# Patient Record
Sex: Male | Born: 1935 | Race: Black or African American | Hispanic: No | Smoking: Never smoker
Health system: Southern US, Community
[De-identification: ages and names within clinical notes are randomized; demographics above are authoritative.]

## PROBLEM LIST (undated history)

## (undated) DIAGNOSIS — G9341 Metabolic encephalopathy: Secondary | ICD-10-CM

## (undated) DIAGNOSIS — E785 Hyperlipidemia, unspecified: Secondary | ICD-10-CM

## (undated) DIAGNOSIS — R55 Syncope and collapse: Secondary | ICD-10-CM

## (undated) DIAGNOSIS — E119 Type 2 diabetes mellitus without complications: Secondary | ICD-10-CM

## (undated) DIAGNOSIS — R413 Other amnesia: Secondary | ICD-10-CM

## (undated) HISTORY — PX: CYST REMOVAL TRUNK: SHX6283

---

## 2020-08-13 ENCOUNTER — Ambulatory Visit (HOSPITAL_COMMUNITY)
Admission: EM | Admit: 2020-08-13 | Discharge: 2020-08-13 | Disposition: A | Discharge status: Home / Self Care | Payer: Medicare Other | Attending: Family Medicine | Admitting: Family Medicine

## 2020-08-13 ENCOUNTER — Encounter (HOSPITAL_COMMUNITY): Payer: Self-pay

## 2020-08-13 ENCOUNTER — Other Ambulatory Visit: Payer: Self-pay

## 2020-08-13 DIAGNOSIS — R4182 Altered mental status, unspecified: Secondary | ICD-10-CM | POA: Insufficient documentation

## 2020-08-13 DIAGNOSIS — Z5189 Encounter for other specified aftercare: Secondary | ICD-10-CM | POA: Insufficient documentation

## 2020-08-13 LAB — CBC WITH DIFFERENTIAL/PLATELET
Abs Immature Granulocytes: 0 10*3/uL (ref 0.00–0.07)
Basophils Absolute: 0 10*3/uL (ref 0.0–0.1)
Basophils Relative: 0 %
Eosinophils Absolute: 0.1 10*3/uL (ref 0.0–0.5)
Eosinophils Relative: 3 %
HCT: 30.6 % — ABNORMAL LOW (ref 39.0–52.0)
Hemoglobin: 10.8 g/dL — ABNORMAL LOW (ref 13.0–17.0)
Lymphocytes Relative: 26 %
Lymphs Abs: 1.2 10*3/uL (ref 0.7–4.0)
MCH: 35.1 pg — ABNORMAL HIGH (ref 26.0–34.0)
MCHC: 35.3 g/dL (ref 30.0–36.0)
MCV: 99.4 fL (ref 80.0–100.0)
Monocytes Absolute: 0.3 10*3/uL (ref 0.1–1.0)
Monocytes Relative: 7 %
Neutro Abs: 2.9 10*3/uL (ref 1.7–7.7)
Neutrophils Relative %: 64 %
Platelets: 305 10*3/uL (ref 150–400)
RBC: 3.08 MIL/uL — ABNORMAL LOW (ref 4.22–5.81)
RDW: 15.1 % (ref 11.5–15.5)
WBC: 4.6 10*3/uL (ref 4.0–10.5)
nRBC: 0 % (ref 0.0–0.2)

## 2020-08-13 LAB — POCT URINALYSIS DIPSTICK, ED / UC
Glucose, UA: NEGATIVE mg/dL
Ketones, ur: NEGATIVE mg/dL
Leukocytes,Ua: NEGATIVE
Nitrite: NEGATIVE
Protein, ur: 100 mg/dL — AB
Specific Gravity, Urine: >=1.03 (ref 1.005–1.030)
Urobilinogen, UA: 1 mg/dL (ref 0.0–1.0)
pH: 5.5 (ref 5.0–8.0)

## 2020-08-13 LAB — COMPREHENSIVE METABOLIC PANEL
ALT: 37 U/L (ref 0–44)
AST: 38 U/L (ref 15–41)
Albumin: 3 g/dL — ABNORMAL LOW (ref 3.5–5.0)
Alkaline Phosphatase: 87 U/L (ref 38–126)
Anion gap: 7 (ref 5–15)
BUN: 9 mg/dL (ref 8–23)
CO2: 24 mmol/L (ref 22–32)
Calcium: 8.7 mg/dL — ABNORMAL LOW (ref 8.9–10.3)
Chloride: 108 mmol/L (ref 98–111)
Creatinine, Ser: 1.08 mg/dL (ref 0.61–1.24)
GFR, Estimated: >60 mL/min (ref >60)
Glucose, Bld: 108 mg/dL — ABNORMAL HIGH (ref 70–99)
Potassium: 3.8 mmol/L (ref 3.5–5.1)
Sodium: 139 mmol/L (ref 135–145)
Total Bilirubin: 0.6 mg/dL (ref 0.3–1.2)
Total Protein: 7.6 g/dL (ref 6.5–8.1)

## 2020-08-13 NOTE — ED Triage Notes (Signed)
Pt's. Son report pt is here for wound check. Right lateral chest area cyst was removed back in October. Son reports new cyst appeared to upper left shoulder area as well about two weeks ago. Son reports housekeeper shared pt not eating well and weakness noted. Son shares concerns with mental status changes that he noted Thursday.   At this time unable to confirm home medications.   Son reports no abdominal pain, fever, or chills.

## 2020-08-13 NOTE — Discharge Instructions (Addendum)
We are waiting on your blood tests and will notify you of any significant changes.

## 2020-08-14 LAB — URINE CULTURE: Culture: NO GROWTH

## 2020-08-15 NOTE — ED Provider Notes (Signed)
[facility]   [id number] 08/13/20 Arrival Time: 1554  ASSESSMENT & PLAN:  1. Visit for wound check   2. Altered mental status, unspecified altered mental status type     Son reports much improvement since his father has been with him. Eating normally now. Mental status at baseline. CBC, CMP pending. No signs of infection on U/A. Son is comfortable with outpatient observation.   Follow-up Information    [facility] EMERGENCY DEPARTMENT.   Specialty: Emergency Medicine Why: If symptoms worsen in any way. Contact information: [address] [id number] mc [place] [address] [phone]              Reviewed expectations re: course of current medical issues. Questions answered. Outlined signs and symptoms indicating need for more acute intervention. Understanding verbalized. After Visit Summary given.   SUBJECTIVE: History from: Patient's son. [place] 86 [place] son went to pick him up yesterday after housekeeper reported that Samuel Carr was staying in bed most of the day and apparently not eating. Since picking him up he as eaten fairly well. No significant confusion. Afebrile. Ambulatory without difficulty. No LE edema. No known recent illnesses.  Son reports recent "surgery" to R axilla. Desires wound check.  OBJECTIVE:  Vitals:   08/13/20 1643  BP: 128/86  Pulse: 88  Resp: (!) 21  Temp: 99 F (37.2 C)  TempSrc: Oral  SpO2: 99%    Recheck RR: 16 General appearance: alert; no distress Eyes: PERRLA; EOMI; conjunctiva normal HENT: [place]; AT; without nasal congestion Neck: supple  CV: regular Lungs: speaks full sentences without difficulty; unlabored; CTAB Extremities: no edema Skin: warm and dry; well-healed R axilla wound Neurologic: normal gait but slow Psychological: alert and cooperative; normal mood and affect  NKDA  History reviewed. No pertinent past  medical history. Social History   Socioeconomic History  . Marital status: Widowed    Spouse name: Not on file  . Number of children: Not on file  . Years of education: Not on file  . Highest education level: Not on file  Occupational History  . Not on file  Tobacco Use  . Smoking status: Never Smoker  . Smokeless tobacco: Never Used  Substance and Sexual Activity  . Alcohol use: Never  . Drug use: Never  . Sexual activity: Not on file  Other Topics Concern  . Not on file  Social History Narrative  . Not on file   Social Determinants of Health   Financial Resource Strain:   . Difficulty of Paying Living Expenses: Not on file  Food Insecurity:   . Worried About Programme researcher, broadcasting/film/video in the Last Year: Not on file  . Ran Out of Food in the Last Year: Not on file  Transportation Needs:   . Lack of Transportation (Medical): Not on file  . Lack of Transportation (Non-Medical): Not on file  Physical Activity:   . Days of Exercise per Week: Not on file  . Minutes of Exercise per Session: Not on file  Stress:   . Feeling of Stress : Not on file  Social Connections:   . Frequency of Communication with Friends and Family: Not on file  . Frequency of Social Gatherings with Friends and Family: Not on file  . Attends Religious Services: Not on file  . Active Member of Clubs or Organizations: Not on file  . Attends Banker Meetings: Not on file  .  Marital Status: Not on file  Intimate Partner Violence:   . Fear of Current or Ex-Partner: Not on file  . Emotionally Abused: Not on file  . Physically Abused: Not on file  . Sexually Abused: Not on file   History reviewed. No pertinent family history. Past Surgical History:  Procedure Laterality Date  . CYST REMOVAL Janalyn Rouse, MD 08/15/20 [phone]

## 2020-09-21 ENCOUNTER — Encounter: Payer: Self-pay | Admitting: Emergency Medicine

## 2020-09-21 ENCOUNTER — Encounter: Payer: Self-pay | Admitting: Neurology

## 2020-09-21 ENCOUNTER — Emergency Department: Payer: Medicare Other

## 2020-09-21 ENCOUNTER — Other Ambulatory Visit: Payer: Self-pay

## 2020-09-21 ENCOUNTER — Observation Stay
Admission: EM | Admit: 2020-09-21 | Discharge: 2020-09-23 | Disposition: A | Discharge status: Home / Self Care | Payer: Medicare Other | Attending: Internal Medicine | Admitting: Internal Medicine

## 2020-09-21 DIAGNOSIS — W19XXXA Unspecified fall, initial encounter: Secondary | ICD-10-CM | POA: Diagnosis not present

## 2020-09-21 DIAGNOSIS — E119 Type 2 diabetes mellitus without complications: Secondary | ICD-10-CM

## 2020-09-21 DIAGNOSIS — M6281 Muscle weakness (generalized): Secondary | ICD-10-CM | POA: Diagnosis not present

## 2020-09-21 DIAGNOSIS — R55 Syncope and collapse: Secondary | ICD-10-CM | POA: Diagnosis not present

## 2020-09-21 DIAGNOSIS — R404 Transient alteration of awareness: Secondary | ICD-10-CM | POA: Diagnosis not present

## 2020-09-21 DIAGNOSIS — Z20822 Contact with and (suspected) exposure to covid-19: Secondary | ICD-10-CM | POA: Insufficient documentation

## 2020-09-21 DIAGNOSIS — Z23 Encounter for immunization: Secondary | ICD-10-CM | POA: Insufficient documentation

## 2020-09-21 DIAGNOSIS — E785 Hyperlipidemia, unspecified: Secondary | ICD-10-CM | POA: Diagnosis present

## 2020-09-21 DIAGNOSIS — Z79899 Other long term (current) drug therapy: Secondary | ICD-10-CM | POA: Insufficient documentation

## 2020-09-21 DIAGNOSIS — I10 Essential (primary) hypertension: Secondary | ICD-10-CM | POA: Insufficient documentation

## 2020-09-21 DIAGNOSIS — R413 Other amnesia: Secondary | ICD-10-CM | POA: Diagnosis present

## 2020-09-21 DIAGNOSIS — R112 Nausea with vomiting, unspecified: Secondary | ICD-10-CM | POA: Diagnosis not present

## 2020-09-21 DIAGNOSIS — N179 Acute kidney failure, unspecified: Secondary | ICD-10-CM | POA: Diagnosis present

## 2020-09-21 DIAGNOSIS — Y92009 Unspecified place in unspecified non-institutional (private) residence as the place of occurrence of the external cause: Secondary | ICD-10-CM | POA: Insufficient documentation

## 2020-09-21 DIAGNOSIS — Z7984 Long term (current) use of oral hypoglycemic drugs: Secondary | ICD-10-CM | POA: Insufficient documentation

## 2020-09-21 DIAGNOSIS — R1110 Vomiting, unspecified: Secondary | ICD-10-CM | POA: Diagnosis present

## 2020-09-21 DIAGNOSIS — G9341 Metabolic encephalopathy: Secondary | ICD-10-CM | POA: Diagnosis present

## 2020-09-21 HISTORY — DX: Type 2 diabetes mellitus without complications: E11.9

## 2020-09-21 HISTORY — DX: Other amnesia: R41.3

## 2020-09-21 HISTORY — DX: Hyperlipidemia, unspecified: E78.5

## 2020-09-21 LAB — CBC
HCT: 35.9 % — ABNORMAL LOW (ref 39.0–52.0)
Hemoglobin: 12.1 g/dL — ABNORMAL LOW (ref 13.0–17.0)
MCH: 33.2 pg (ref 26.0–34.0)
MCHC: 33.7 g/dL (ref 30.0–36.0)
MCV: 98.4 fL (ref 80.0–100.0)
Platelets: 276 10*3/uL (ref 150–400)
RBC: 3.65 MIL/uL — ABNORMAL LOW (ref 4.22–5.81)
RDW: 14.6 % (ref 11.5–15.5)
WBC: 10.2 10*3/uL (ref 4.0–10.5)
nRBC: 0 % (ref 0.0–0.2)

## 2020-09-21 IMAGING — CT CT CERVICAL SPINE W/O CM
2 series · 11 of 27 positions shown, 14 images · non-contrast
Comparison: None.

CLINICAL DATA: Head trauma unconscious

EXAM:
CT HEAD WITHOUT CONTRAST
CT CERVICAL SPINE WITHOUT CONTRAST
TECHNIQUE: Multidetector CT imaging of the head and cervical spine was
performed following the standard protocol without intravenous
contrast. Multiplanar CT image reconstructions of the cervical spine
were also generated.

[Series 3: c spine soft · axial · 0.31mm/px · z∈[+267,+375]mm · 6 of 71 slices shown, 8 images]
[im 11/71  soft-tissue]
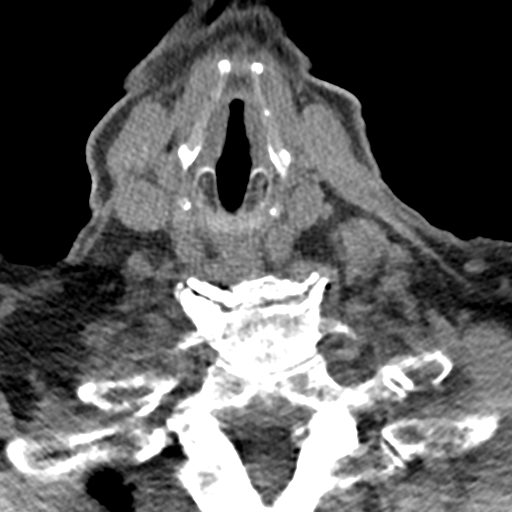
[im 11/71  bone]
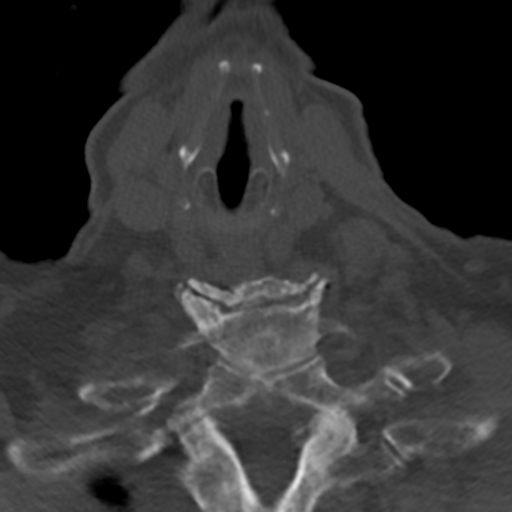
[im 22/71  bone]
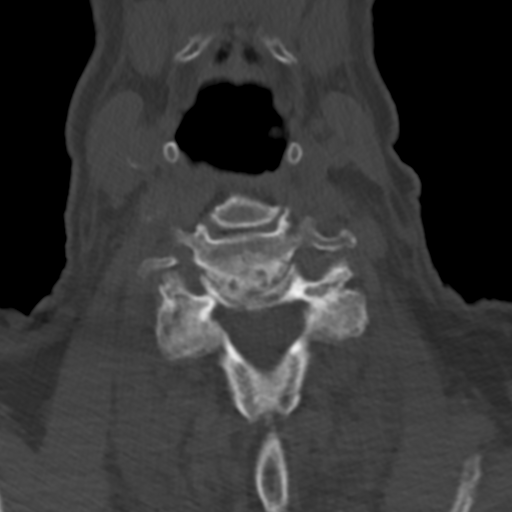
[im 33/71  bone]
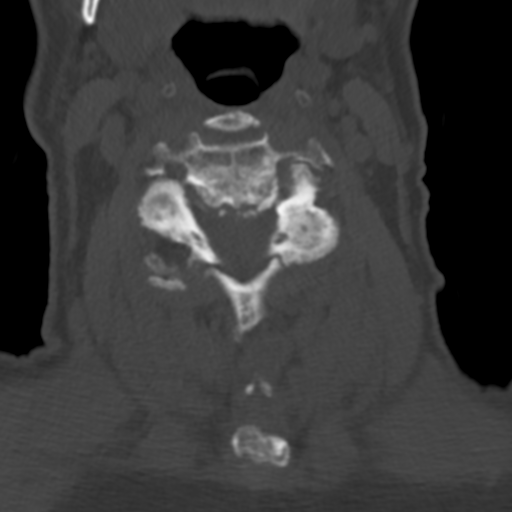
[im 44/71  bone]
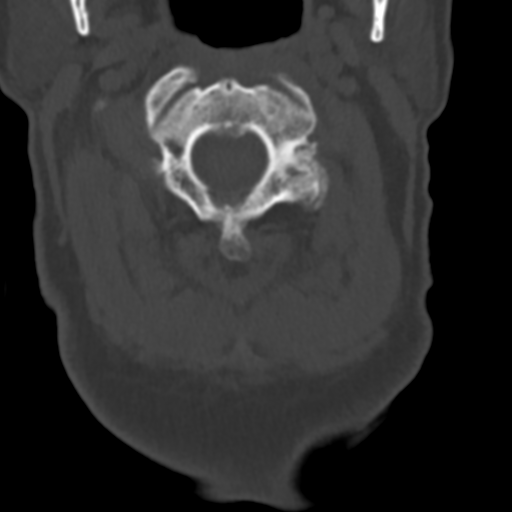
[im 54/71  soft-tissue]
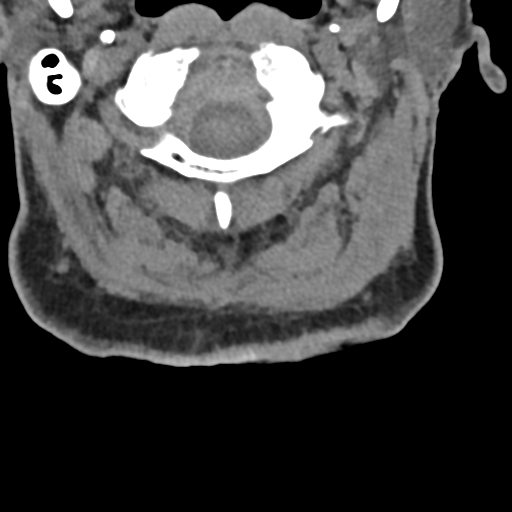
[im 54/71  bone]
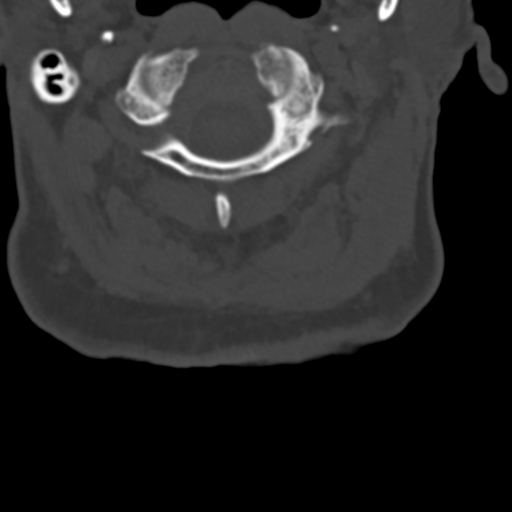
[im 65/71  bone]
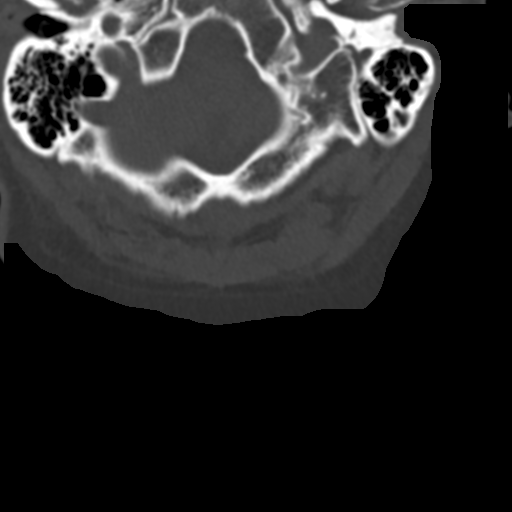

[Series 4: sagittal bone · sagittal · 0.23mm/px · 5 of 71 slices shown, 6 images]
[im 24/71  bone]
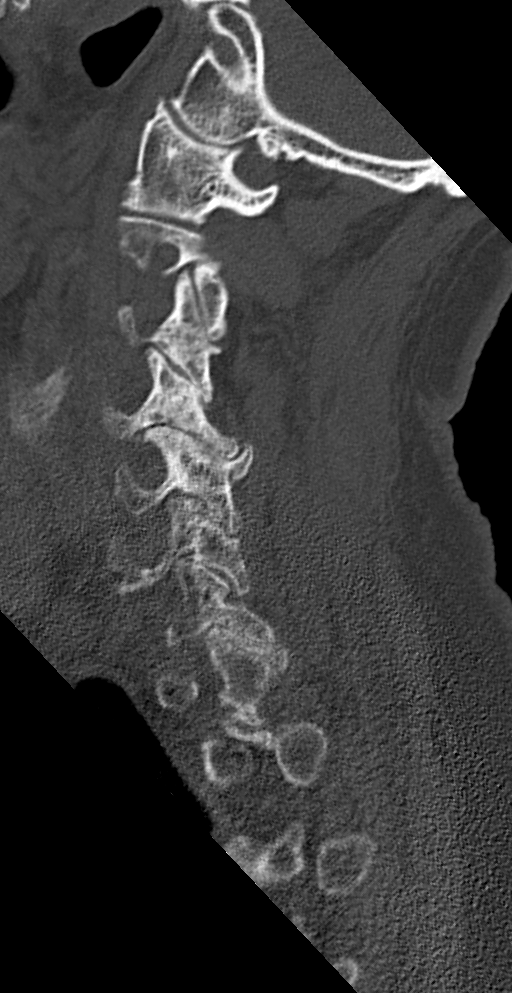
[im 30/71  bone]
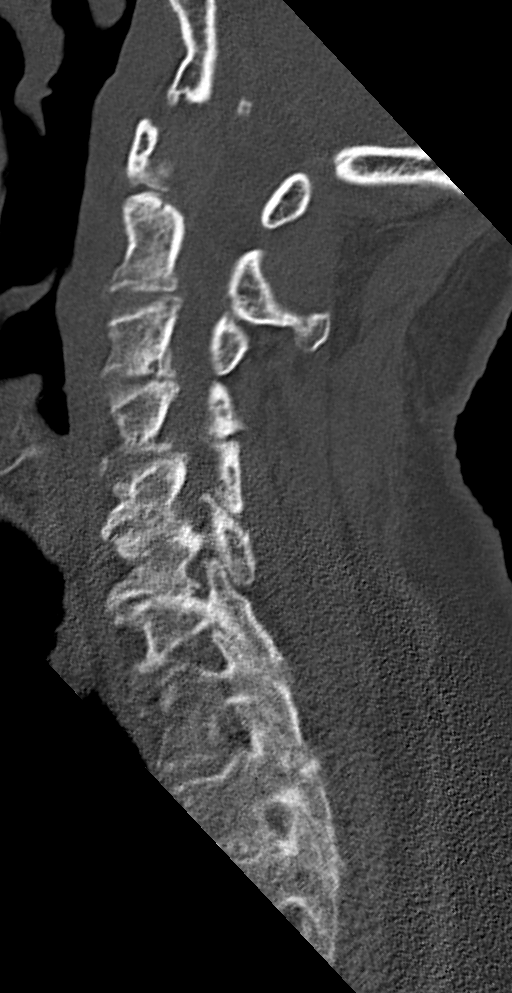
[im 36/71  soft-tissue]
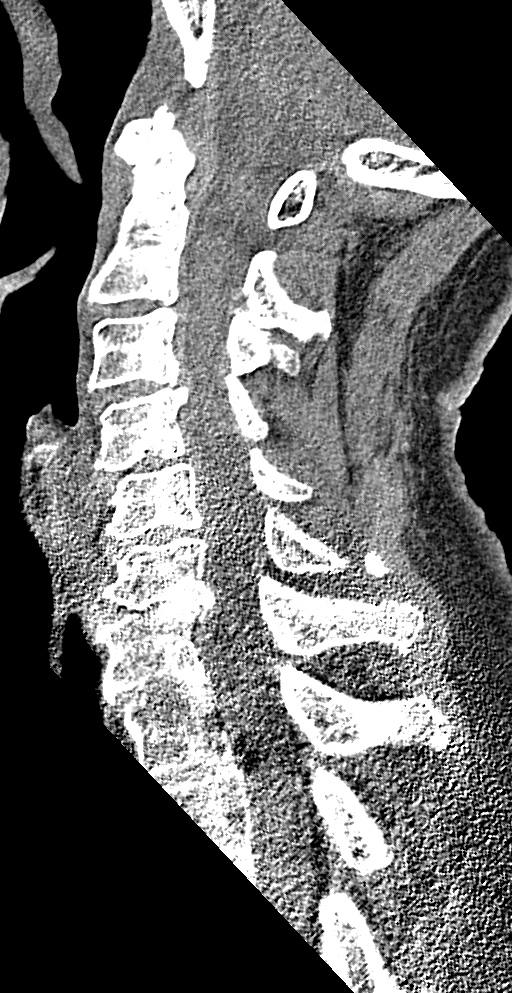
[im 36/71  bone]
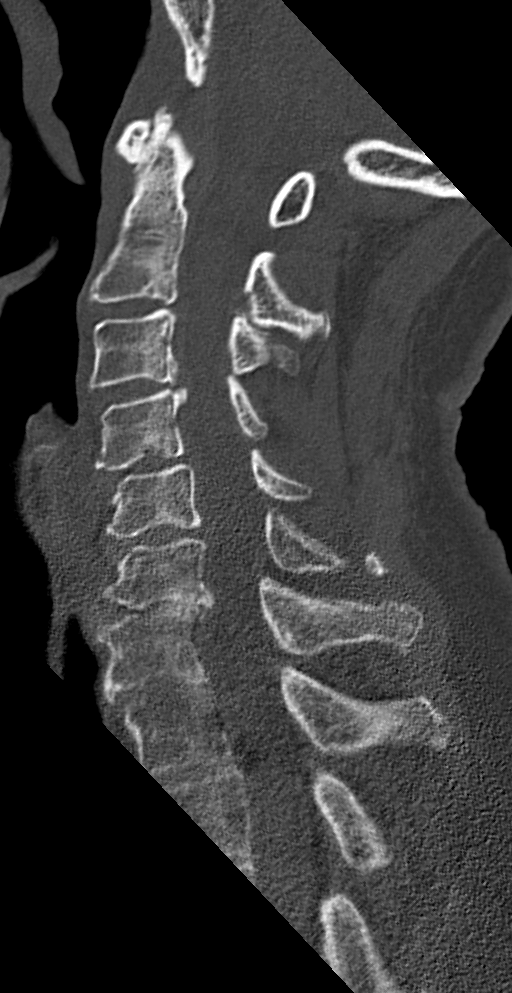
[im 41/71  bone]
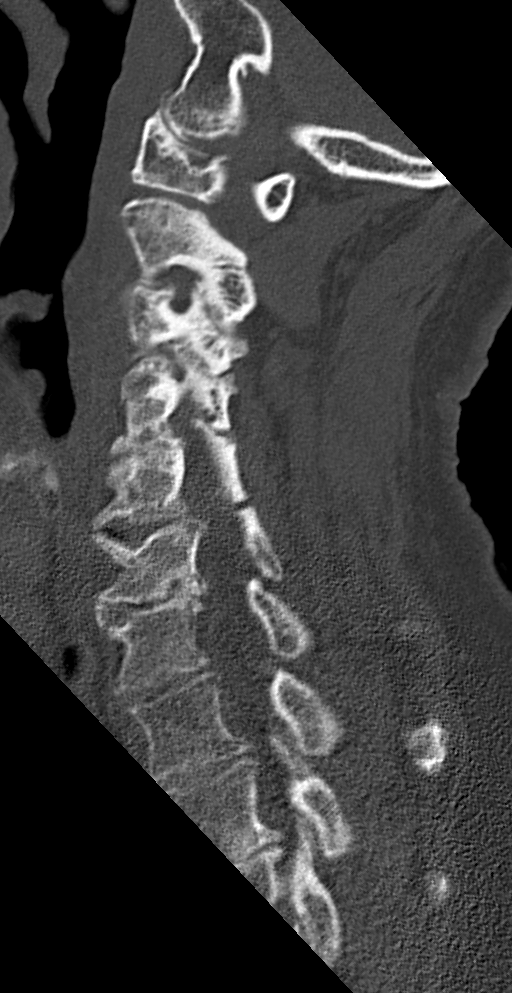
[im 47/71  bone]
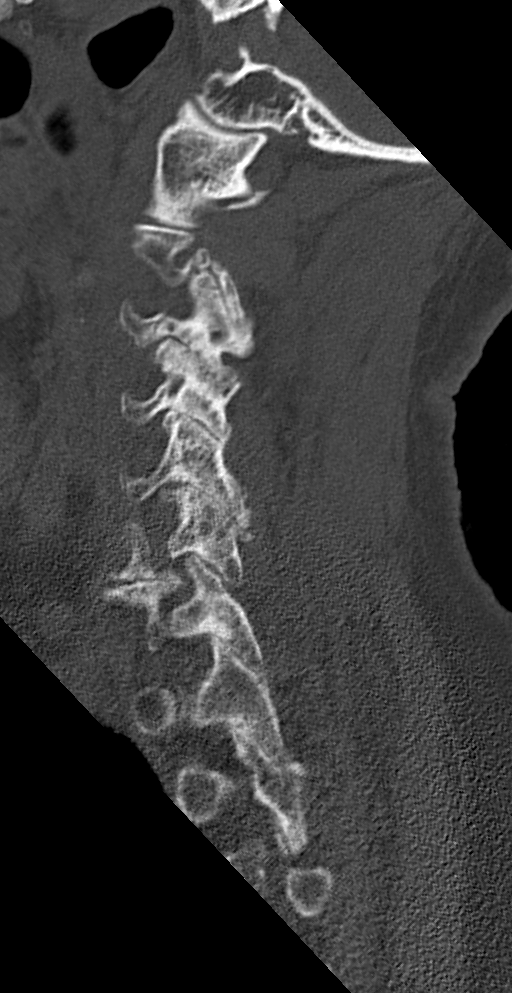

[11 of 27 positions shown; findings below may reference images not displayed]

FINDINGS: CT HEAD FINDINGS

Brain: No acute territorial infarction, hemorrhage, or intracranial
mass. Moderate atrophy. Encephalomalacia within the bilateral
frontal lobes. Moderate hypodensity in the white matter consistent
with chronic small vessel ischemic change. Nondilated ventricles.

Vascular: No hyperdense vessels.  Carotid vascular calcification

Skull: Normal. Negative for fracture or focal lesion.

Sinuses/Orbits: No acute finding.

Other: None

CT CERVICAL SPINE FINDINGS

Alignment: Generalized straightening of the cervical spine. Trace
retrolisthesis C6 on C7.

Skull base and vertebrae: Vertebral body heights are maintained.
There is no fracture identified. Poorly visible superior endplate at
T1 with possible lytic lesion in the upper portion of the vertebral
body.

Soft tissues and spinal canal: No prevertebral fluid or swelling. No
visible canal hematoma.

Disc levels: Multiple level degenerative change with advanced
disease at C6-C7 and C7-T1.

Upper chest: Negative.

Other: None
IMPRESSION: 1. No CT evidence for acute intracranial abnormality. Atrophy and
chronic small vessel ischemic changes of the white matter.
Encephalomalacia of the bilateral frontal lobes.
2. Poorly visible superior endplate at T1 with possible lytic lesion
in the upper portion of the vertebral body. Consider further
evaluation with MRI.
3. Straightening of the cervical spine with degenerative changes. No
acute osseous abnormality.

## 2020-09-21 IMAGING — CT CT HEAD W/O CM
4 series · 15 of 47 positions shown, 17 images · non-contrast
Comparison: None.

CLINICAL DATA: Head trauma unconscious

EXAM:
CT HEAD WITHOUT CONTRAST
CT CERVICAL SPINE WITHOUT CONTRAST
TECHNIQUE: Multidetector CT imaging of the head and cervical spine was
performed following the standard protocol without intravenous
contrast. Multiplanar CT image reconstructions of the cervical spine
were also generated.

[Series 2: head wo · axial · 0.42mm/px · z∈[+384,+494]mm · 7 of 30 slices shown, 9 images]
[im 4/30  brain]
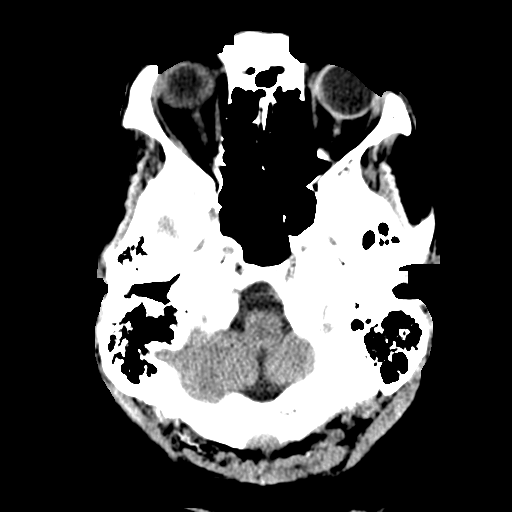
[im 4/30  bone]
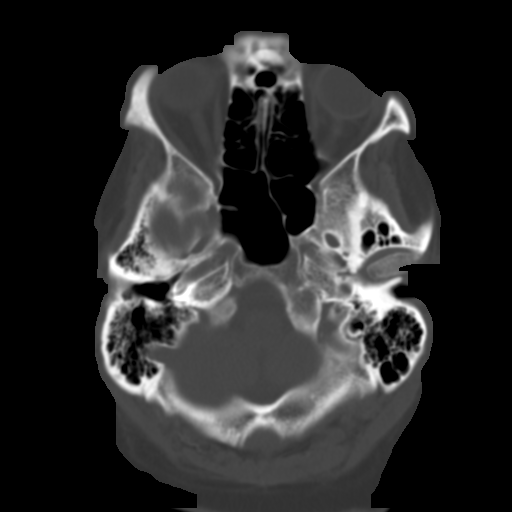
[im 8/30  brain]
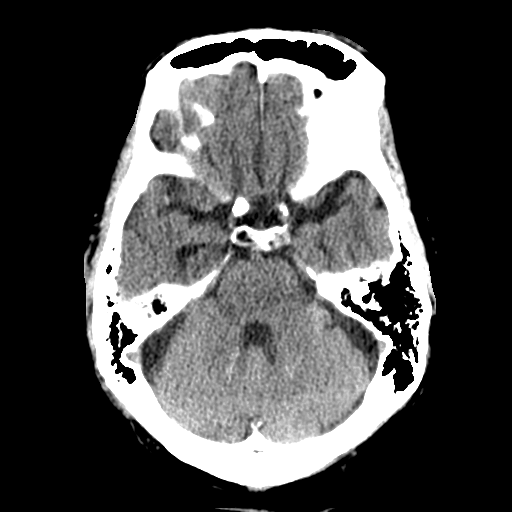
[im 11/30  brain]
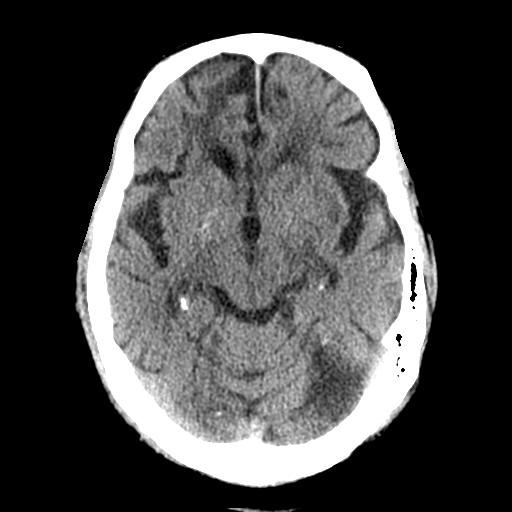
[im 15/30  brain]
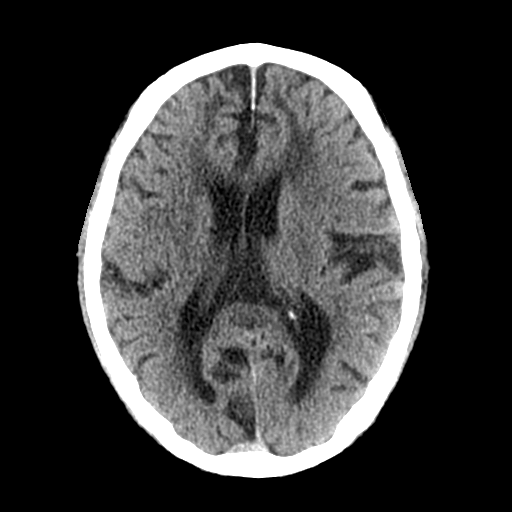
[im 19/30  brain]
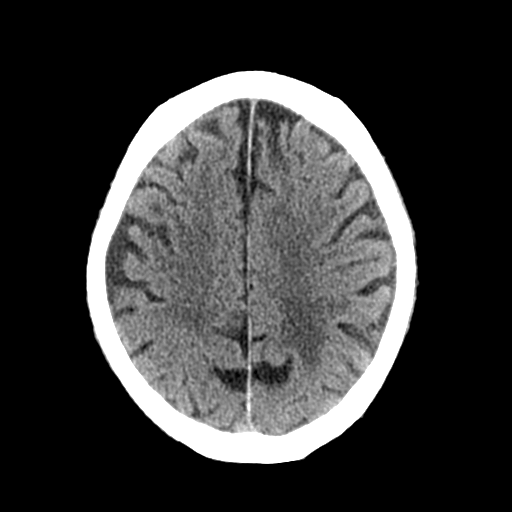
[im 19/30  bone]
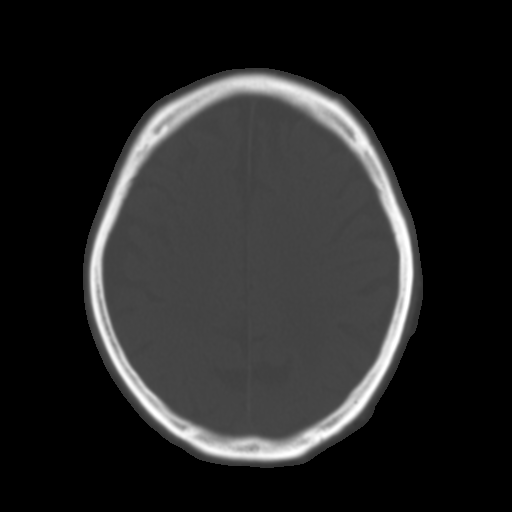
[im 22/30  brain]
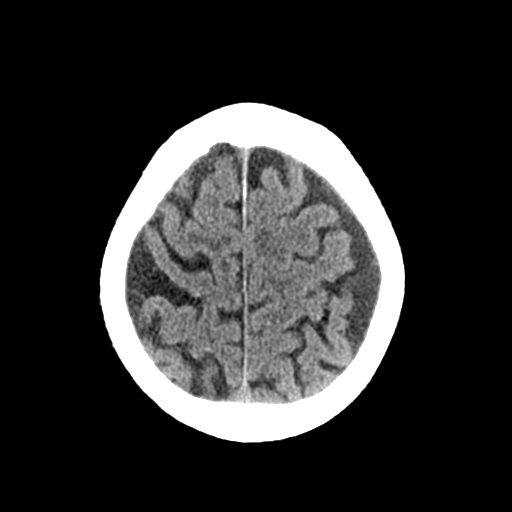
[im 26/30  brain]
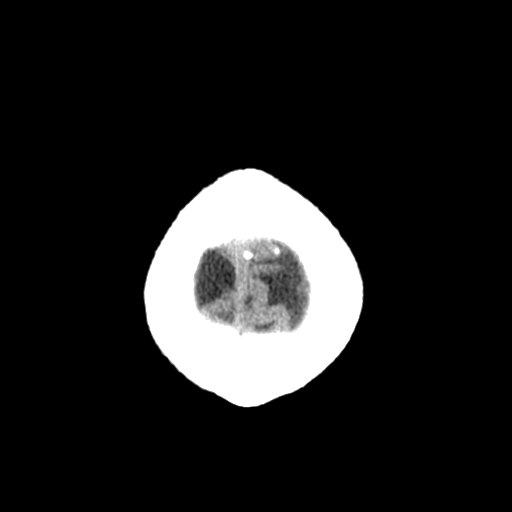

[Series 3: head bone · axial · 0.42mm/px · z∈[+383,+397]mm · 2 of 74 slices shown]
[im 8/74  bone]
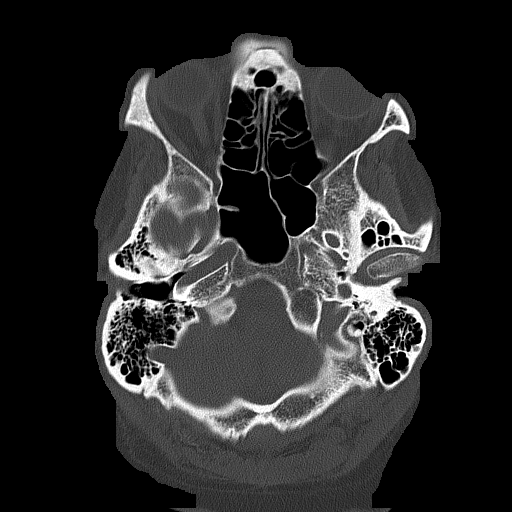
[im 15/74  bone]
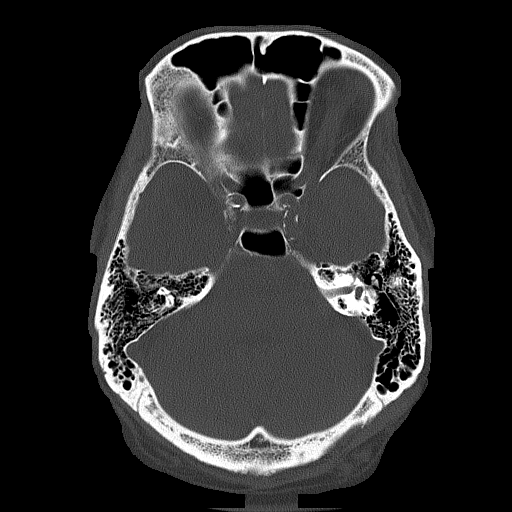

[Series 4: coronal soft tissue · coronal · 0.31mm/px · 3 of 70 slices shown]
[im 24/70  brain]
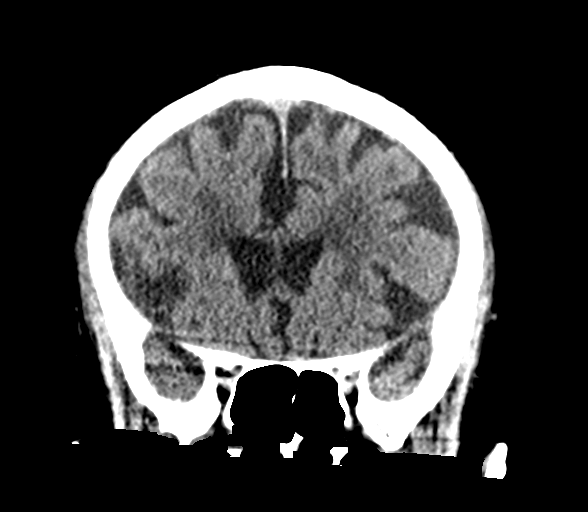
[im 31/70  brain]
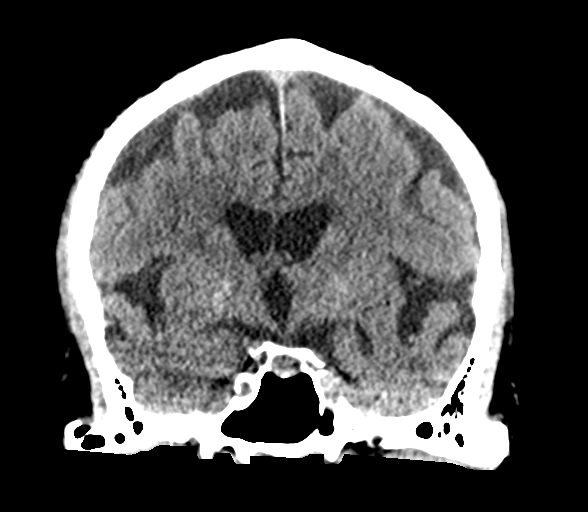
[im 39/70  brain]
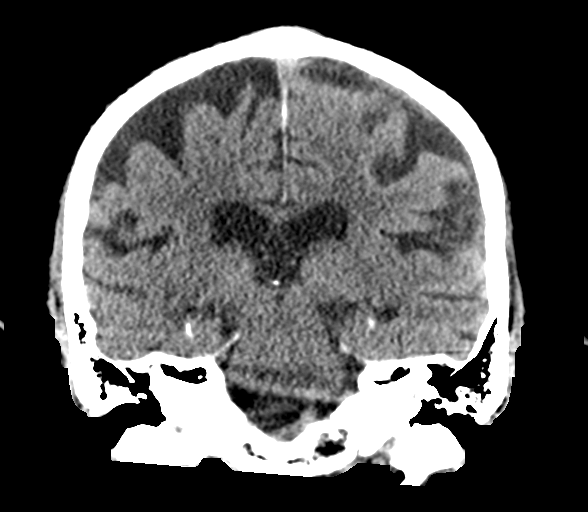

[Series 5: sagittal soft tissue · sagittal · 0.31mm/px · 3 of 56 slices shown]
[im 19/56  brain]
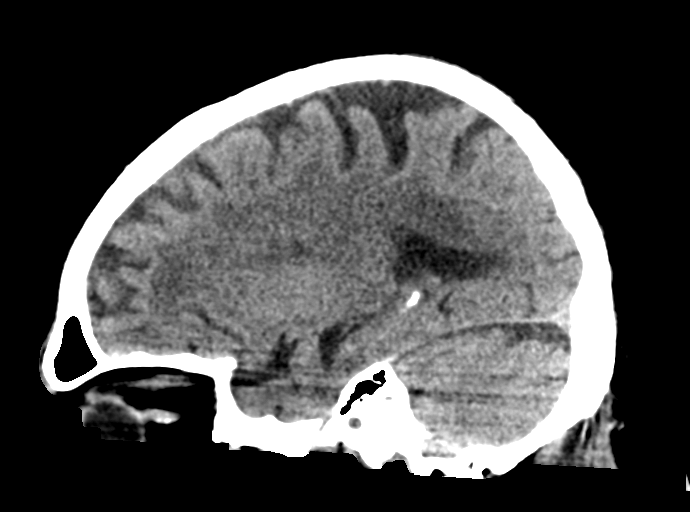
[im 28/56  brain]
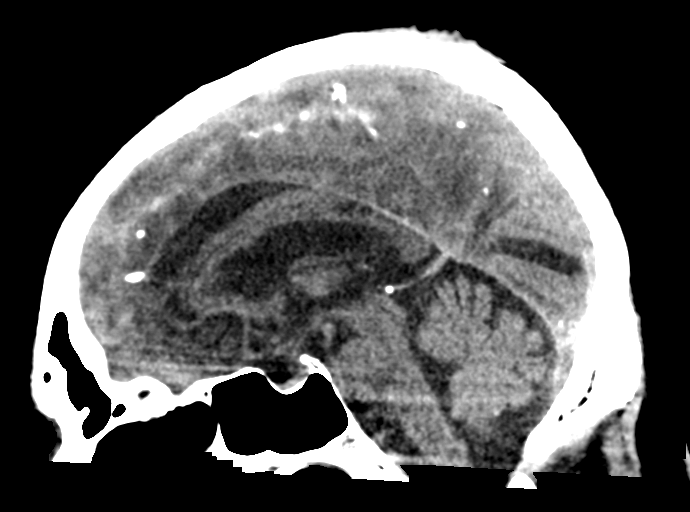
[im 37/56  brain]
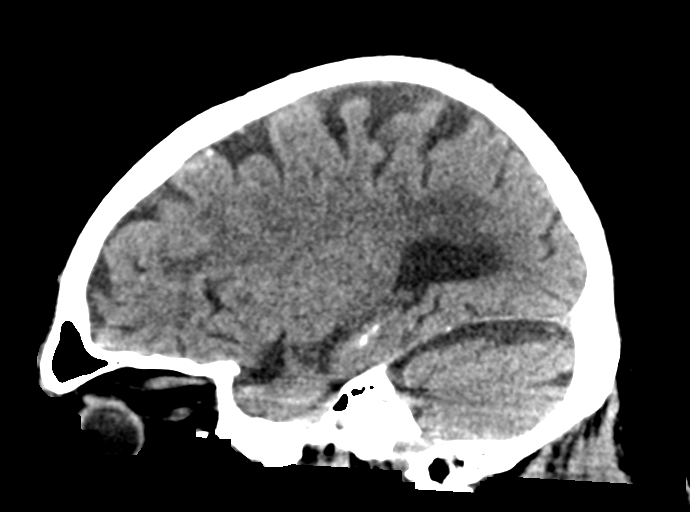

[15 of 47 positions shown; findings below may reference images not displayed]

FINDINGS: CT HEAD FINDINGS

Brain: No acute territorial infarction, hemorrhage, or intracranial
mass. Moderate atrophy. Encephalomalacia within the bilateral
frontal lobes. Moderate hypodensity in the white matter consistent
with chronic small vessel ischemic change. Nondilated ventricles.

Vascular: No hyperdense vessels.  Carotid vascular calcification

Skull: Normal. Negative for fracture or focal lesion.

Sinuses/Orbits: No acute finding.

Other: None

CT CERVICAL SPINE FINDINGS

Alignment: Generalized straightening of the cervical spine. Trace
retrolisthesis C6 on C7.

Skull base and vertebrae: Vertebral body heights are maintained.
There is no fracture identified. Poorly visible superior endplate at
T1 with possible lytic lesion in the upper portion of the vertebral
body.

Soft tissues and spinal canal: No prevertebral fluid or swelling. No
visible canal hematoma.

Disc levels: Multiple level degenerative change with advanced
disease at C6-C7 and C7-T1.

Upper chest: Negative.

Other: None
IMPRESSION: 1. No CT evidence for acute intracranial abnormality. Atrophy and
chronic small vessel ischemic changes of the white matter.
Encephalomalacia of the bilateral frontal lobes.
2. Poorly visible superior endplate at T1 with possible lytic lesion
in the upper portion of the vertebral body. Consider further
evaluation with MRI.
3. Straightening of the cervical spine with degenerative changes. No
acute osseous abnormality.

## 2020-09-21 NOTE — ED Triage Notes (Addendum)
Pt to ED after an unwitnessed fall at home. Family heard the fall and came to patient immediately and found pt unconscious face down on the floor. Pt assisted to a chair where he became conscious. Pt then had two episodes of vomiting and lost his bowels and urinated. No hx of seizures. Pt then started reporting family his neck was sore after the fall.   Family reports they also found pt with his pants down so they are unsure if he tripped and lost consciousness after falling or lost  consciousness before falling. Pt has new onset of confusion over the past couple weeks including not recognizing his daughter. Son reports they have been trying to have him seen for dementia. Per son pt is now acting at his baseline.

## 2020-09-21 NOTE — ED Notes (Signed)
Lab at bedside to draw repeat blood

## 2020-09-21 NOTE — ED Notes (Signed)
Son with patient. Patient is not wearing hearing aides and is very hard of hearing.

## 2020-09-22 ENCOUNTER — Emergency Department: Payer: Medicare Other

## 2020-09-22 ENCOUNTER — Encounter: Payer: Self-pay | Admitting: Emergency Medicine

## 2020-09-22 ENCOUNTER — Observation Stay: Admit: 2020-09-22 | Payer: Medicare Other

## 2020-09-22 ENCOUNTER — Observation Stay (HOSPITAL_BASED_OUTPATIENT_CLINIC_OR_DEPARTMENT_OTHER)
Admit: 2020-09-22 | Discharge: 2020-09-22 | Disposition: A | Discharge status: Home / Self Care | Payer: Medicare Other | Attending: Cardiovascular Disease | Admitting: Cardiovascular Disease

## 2020-09-22 DIAGNOSIS — R413 Other amnesia: Secondary | ICD-10-CM | POA: Diagnosis not present

## 2020-09-22 DIAGNOSIS — E785 Hyperlipidemia, unspecified: Secondary | ICD-10-CM | POA: Diagnosis present

## 2020-09-22 DIAGNOSIS — R404 Transient alteration of awareness: Secondary | ICD-10-CM | POA: Diagnosis not present

## 2020-09-22 DIAGNOSIS — E119 Type 2 diabetes mellitus without complications: Secondary | ICD-10-CM | POA: Diagnosis not present

## 2020-09-22 DIAGNOSIS — R55 Syncope and collapse: Secondary | ICD-10-CM | POA: Diagnosis present

## 2020-09-22 DIAGNOSIS — G9341 Metabolic encephalopathy: Secondary | ICD-10-CM | POA: Diagnosis not present

## 2020-09-22 DIAGNOSIS — I10 Essential (primary) hypertension: Secondary | ICD-10-CM | POA: Diagnosis present

## 2020-09-22 DIAGNOSIS — N179 Acute kidney failure, unspecified: Secondary | ICD-10-CM | POA: Diagnosis present

## 2020-09-22 DIAGNOSIS — W19XXXA Unspecified fall, initial encounter: Secondary | ICD-10-CM | POA: Diagnosis present

## 2020-09-22 DIAGNOSIS — R112 Nausea with vomiting, unspecified: Secondary | ICD-10-CM

## 2020-09-22 LAB — RESP PANEL BY RT-PCR (FLU A&B, COVID) ARPGX2
Influenza A by PCR: NEGATIVE
Influenza B by PCR: NEGATIVE
SARS Coronavirus 2 by RT PCR: NEGATIVE

## 2020-09-22 LAB — TSH: TSH: 3.732 u[IU]/mL (ref 0.350–4.500)

## 2020-09-22 LAB — CBG MONITORING, ED
Glucose-Capillary: 114 mg/dL — ABNORMAL HIGH (ref 70–99)
Glucose-Capillary: 166 mg/dL — ABNORMAL HIGH (ref 70–99)

## 2020-09-22 LAB — LIPASE, BLOOD: Lipase: 25 U/L (ref 11–51)

## 2020-09-22 LAB — COMPREHENSIVE METABOLIC PANEL
ALT: 13 U/L (ref 0–44)
AST: 23 U/L (ref 15–41)
Albumin: 3.6 g/dL (ref 3.5–5.0)
Alkaline Phosphatase: 81 U/L (ref 38–126)
Anion gap: 13 (ref 5–15)
BUN: 22 mg/dL (ref 8–23)
CO2: 20 mmol/L — ABNORMAL LOW (ref 22–32)
Calcium: 9 mg/dL (ref 8.9–10.3)
Chloride: 103 mmol/L (ref 98–111)
Creatinine, Ser: 1.43 mg/dL — ABNORMAL HIGH (ref 0.61–1.24)
GFR, Estimated: 47 mL/min — ABNORMAL LOW (ref >60)
Glucose, Bld: 162 mg/dL — ABNORMAL HIGH (ref 70–99)
Potassium: 4.3 mmol/L (ref 3.5–5.1)
Sodium: 136 mmol/L (ref 135–145)
Total Bilirubin: 0.6 mg/dL (ref 0.3–1.2)
Total Protein: 7.8 g/dL (ref 6.5–8.1)

## 2020-09-22 LAB — ECHOCARDIOGRAM COMPLETE
AR max vel: 3.19 cm2
AV Area VTI: 3.81 cm2
AV Area mean vel: 3.23 cm2
AV Mean grad: 6 mmHg
AV Peak grad: 11.2 mmHg
Ao pk vel: 1.67 m/s
Area-P 1/2: 4.31 cm2
Height: 70 in
S' Lateral: 3.17 cm

## 2020-09-22 LAB — BRAIN NATRIURETIC PEPTIDE: B Natriuretic Peptide: 24.3 pg/mL (ref 0.0–100.0)

## 2020-09-22 LAB — TROPONIN I (HIGH SENSITIVITY)
Troponin I (High Sensitivity): 5 ng/L (ref <18)
Troponin I (High Sensitivity): 7 ng/L (ref <18)

## 2020-09-22 LAB — VITAMIN B12: Vitamin B-12: 273 pg/mL (ref 180–914)

## 2020-09-22 LAB — PROTIME-INR
INR: 1 (ref 0.8–1.2)
Prothrombin Time: 12.7 seconds (ref 11.4–15.2)

## 2020-09-22 LAB — GLUCOSE, CAPILLARY
Glucose-Capillary: 95 mg/dL (ref 70–99)
Glucose-Capillary: 135 mg/dL — ABNORMAL HIGH (ref 70–99)

## 2020-09-22 LAB — HEMOGLOBIN A1C
Hgb A1c MFr Bld: 6.1 % — ABNORMAL HIGH (ref 4.8–5.6)
Mean Plasma Glucose: 128.37 mg/dL

## 2020-09-22 LAB — MAGNESIUM: Magnesium: 1.7 mg/dL (ref 1.7–2.4)

## 2020-09-22 LAB — CK: Total CK: 113 U/L (ref 49–397)

## 2020-09-22 LAB — APTT: aPTT: 25 seconds (ref 24–36)

## 2020-09-22 IMAGING — MR MR THORACIC SPINE WO/W CM
10 of 15 series · 31 of 48 positions shown · IV contrast (gadavist)
Comparison: Cervical spine CT and head CT [DATE].

CLINICAL DATA: 89-year-old male status post unwitnessed fall at
home. Questionable T1 superior endplate lytic lesion on cervical
spine CT last night.

EXAM:
MRI THORACIC WITHOUT AND WITH CONTRAST
TECHNIQUE: Multiplanar and multiecho pulse sequences of the thoracic spine were
obtained without and with intravenous contrast.
CONTRAST:  8mL GADAVIST GADOBUTROL 1 MMOL/ML IV SOLN

[Series 24: T1 · sagittal · 5.0mm · 1.88mm/px · 1 of 9 slices shown (1 of 2)]
[im 1/9]
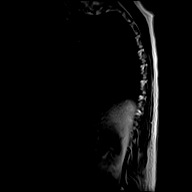

[Series 25: T2 · sagittal · 3.0mm · 1.06mm/px · 3 of 23 slices shown (1 of 3)]
[im 1/23]
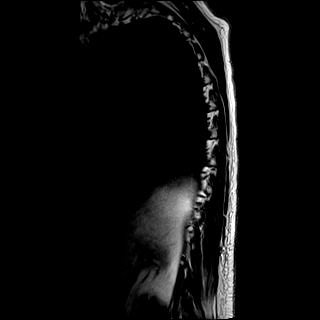
[im 12/23]
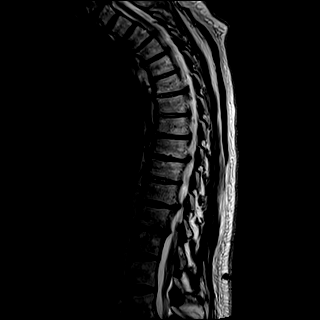
[im 23/23]
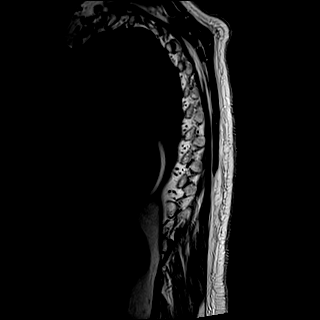

[Series 26: T1 · sagittal · 3.0mm · 1.06mm/px · 3 of 23 slices shown (2 of 2)]
[im 1/23]
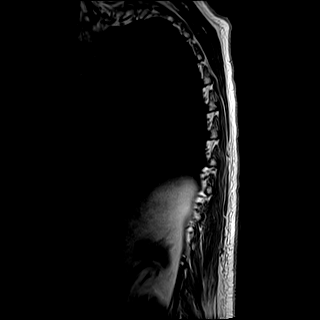
[im 12/23]
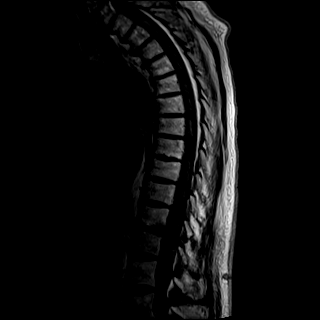
[im 23/23]
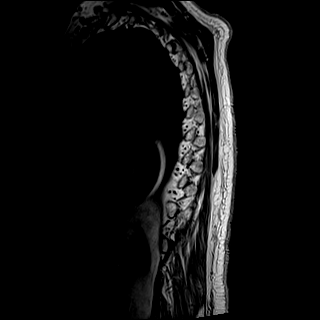

[Series 27: STIR · sagittal · 3.0mm · 0.53mm/px · 3 of 23 slices shown]
[im 1/23]
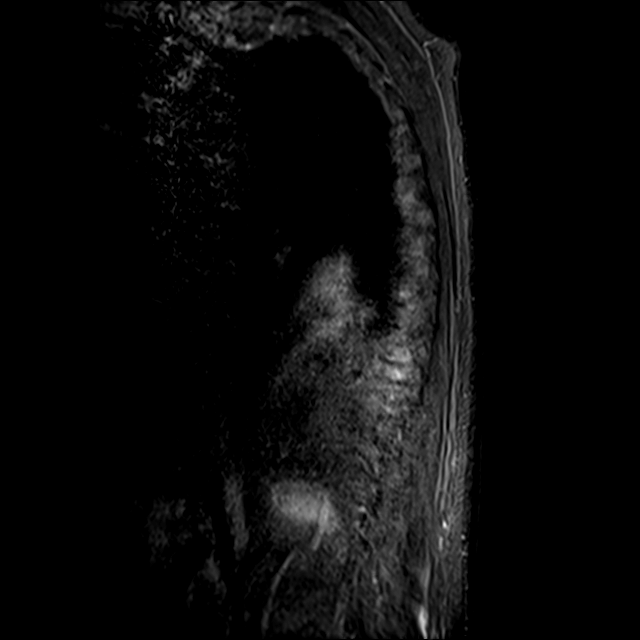
[im 12/23]
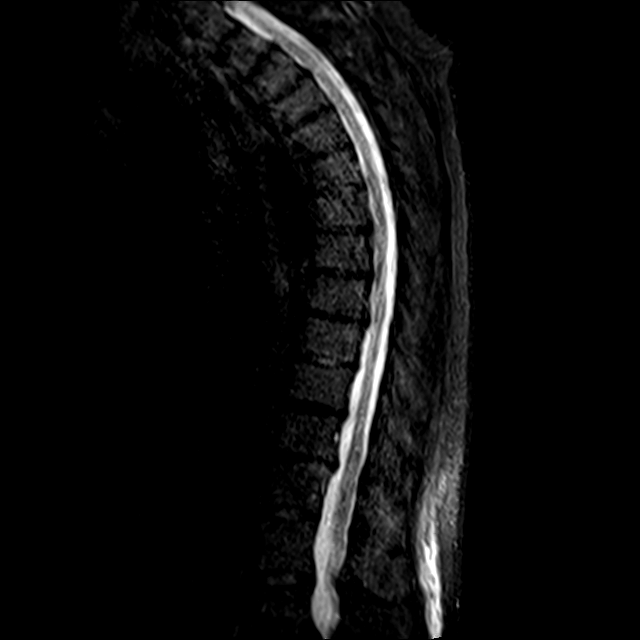
[im 23/23]
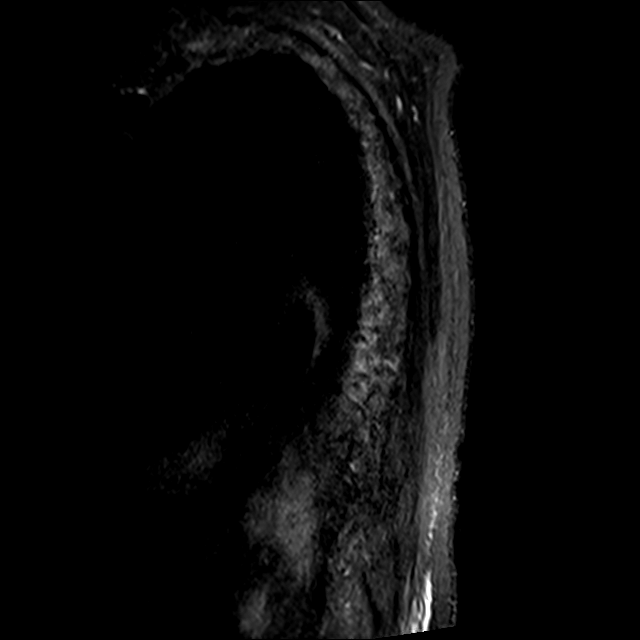

[Series 29: T2 · axial · 4.0mm · 0.59mm/px · z∈[-67,+281]mm · 4 of 36 slices shown (2 of 3)]
[im 1/36]
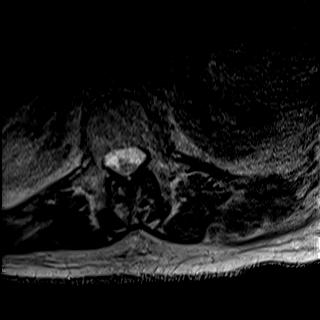
[im 12/36]
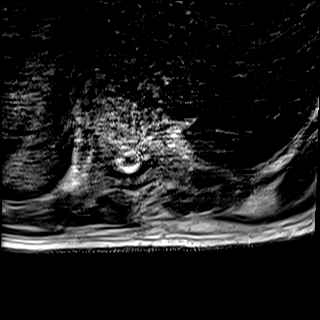
[im 24/36]
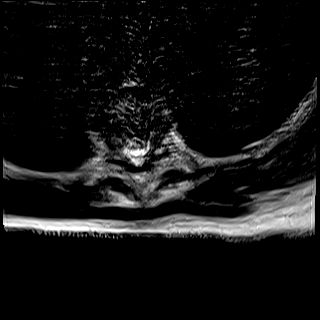
[im 36/36]
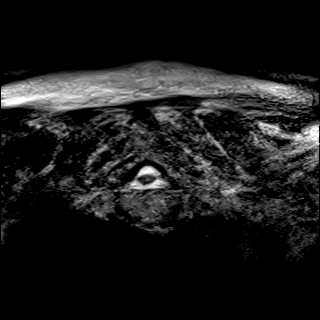

[Series 31: T2 · axial · 4.0mm · 0.59mm/px · z∈[-67,+281]mm · 4 of 36 slices shown (3 of 3)]
[im 1/36]
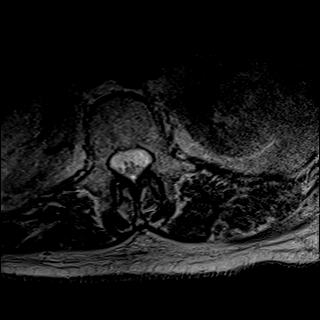
[im 12/36]
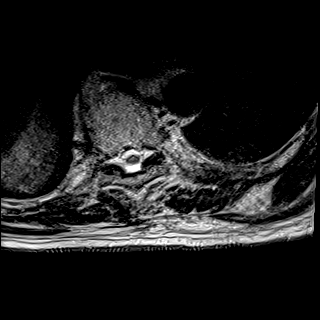
[im 24/36]
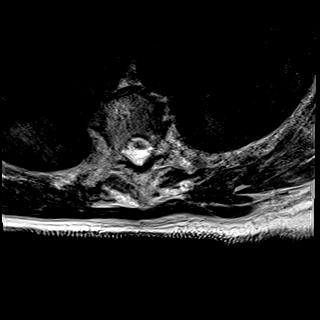
[im 36/36]
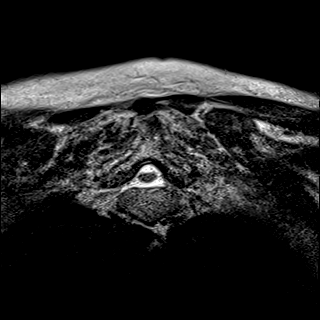

[Series 33: T1 post-contrast · axial · non-contrast · 4.0mm · 0.37mm/px · z∈[-68,+281]mm · 4 of 36 slices shown]
[im 1/36]
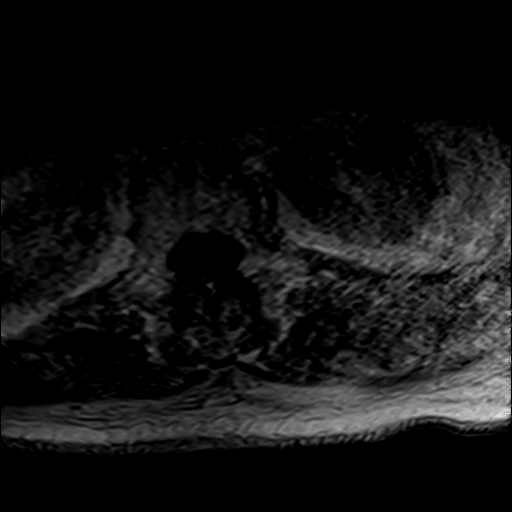
[im 12/36]
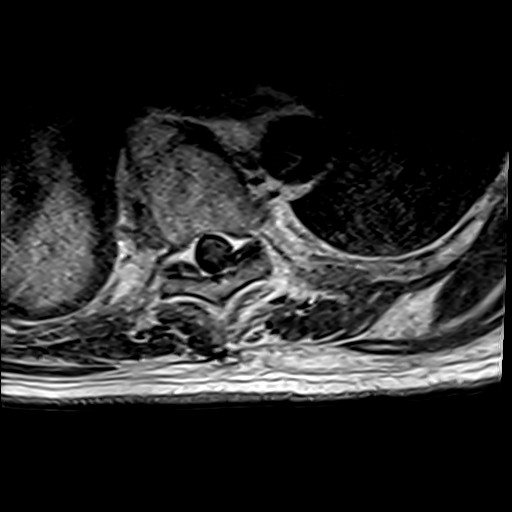
[im 24/36]
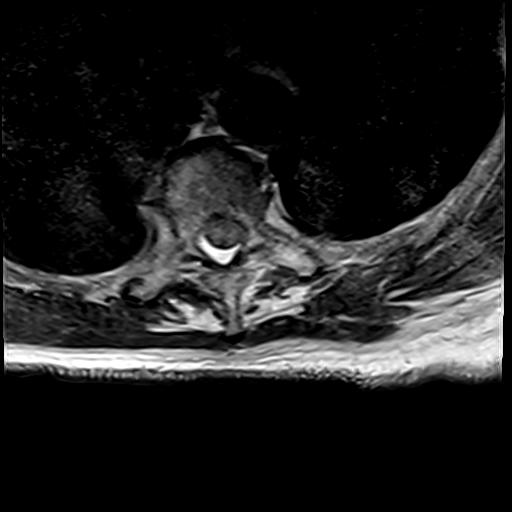
[im 36/36]
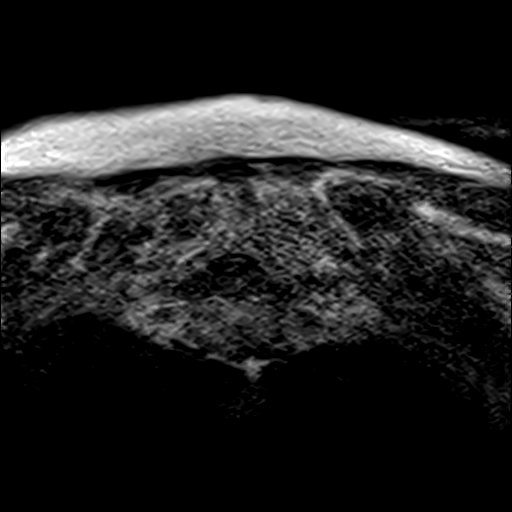

[Series 36: T1 fat-sat post-contrast · sagittal · 3.0mm · 1.06mm/px · 3 of 23 slices shown (1 of 3)]
[im 1/23]
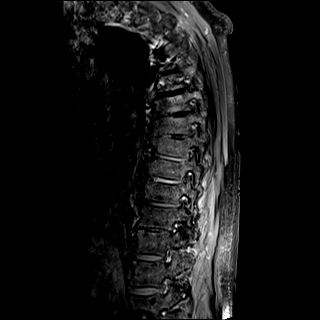
[im 12/23]
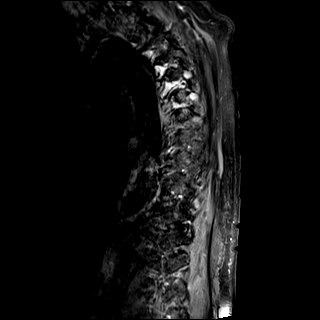
[im 23/23]
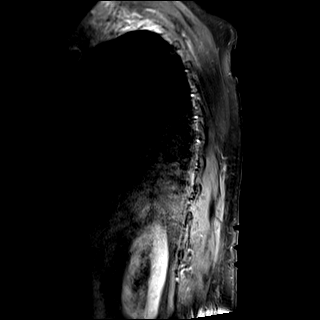

[Series 37: T1 fat-sat post-contrast · sagittal · 3.0mm · 1.06mm/px · 3 of 25 slices shown (2 of 3)]
[im 1/25]
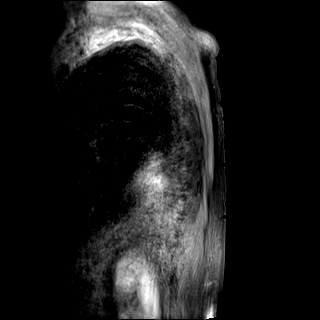
[im 13/25]
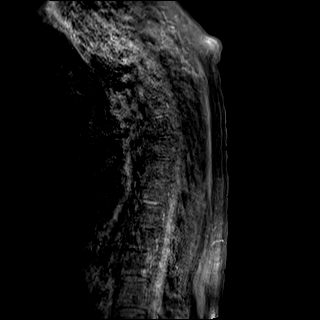
[im 25/25]
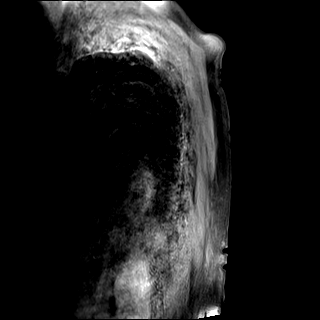

[Series 38: T1 fat-sat post-contrast · sagittal · 3.0mm · 1.06mm/px · 3 of 25 slices shown (3 of 3)]
[im 1/25]
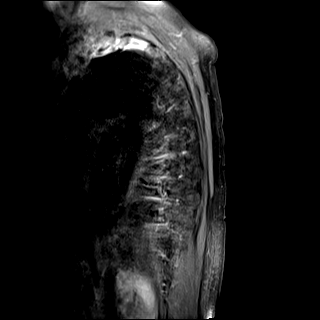
[im 13/25]
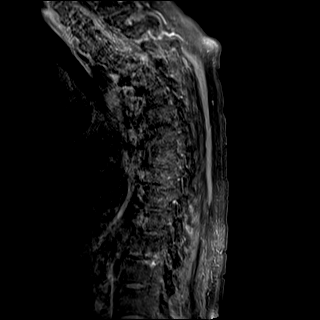
[im 25/25]
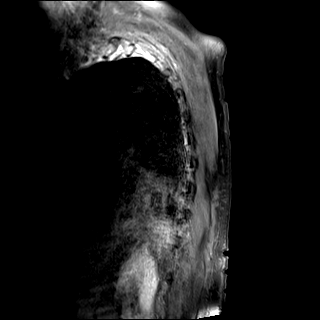

[31 of 48 positions shown; findings below may reference images not displayed]

FINDINGS: Limited cervical spine imaging: Stable to the cervical spine CT
today, with retrolisthesis again noted C6 on C7.

Thoracic spine segmentation:  Appears to be normal.

Alignment: Exaggerated upper thoracic kyphosis (series 24, image 6).
No thoracic spondylolisthesis.

Vertebrae: Normal T1 and stir signal throughout the T1 vertebral
body (series 24, image 6). No evidence of lytic or other focal
vertebral lesion there.

Likewise, marrow signal throughout the remaining thoracic vertebrae
is within normal limits for age.

There is faint degenerative posterior endplate marrow edema and
enhancement at T11-T12 superimposed on advanced chronic degenerative
endplate changes there. No suspicious marrow lesion.

Cord: Axial images degraded by motion despite repeated imaging
attempts. Thoracic spinal cord appears within normal limits for age.
No cord expansion. No abnormal intradural enhancement. No dural
thickening. Conus medullaris appears to be normal at T12-L1.

Paraspinal and other soft tissues: Negative visible chest and
abdominal viscera aside from cardiomegaly.

Thoracic lumbar paraspinal soft tissues are within normal limits.

Disc levels:

Age-appropriate thoracic spine degeneration.

No significant thoracic spinal stenosis, despite disc and endplate
degeneration with the levels T2-T3, T8-T9 and, and T11-T12 among the
worst affected.
IMPRESSION: 1. Negative for T1 vertebral lesion, artifact suspected on the
earlier CT.
No lytic lesion or suspicious marrow signal in the thoracic spine.

2. Age-appropriate thoracic spine degeneration. No thoracic spinal
stenosis. Thoracic spinal cord within normal limits.

## 2020-09-22 MED ORDER — ENOXAPARIN SODIUM 40 MG/0.4ML ~~LOC~~ SOLN
40.0000 mg | SUBCUTANEOUS | Status: DC
  Administered 2020-09-22 – 2020-09-23 (×2): 40 mg via SUBCUTANEOUS
  Filled 2020-09-22 (×2): qty 0.4

## 2020-09-22 MED ORDER — DILTIAZEM HCL ER BEADS 240 MG PO CP24
240.0000 mg | ORAL_CAPSULE | Freq: Every day | ORAL | Status: DC
  Filled 2020-09-22: qty 1

## 2020-09-22 MED ORDER — SODIUM CHLORIDE 0.9 % IV SOLN: INTRAVENOUS | Status: DC

## 2020-09-22 MED ORDER — DILTIAZEM HCL ER COATED BEADS 240 MG PO CP24
240.0000 mg | ORAL_CAPSULE | Freq: Every day | ORAL | Status: DC
  Administered 2020-09-22 – 2020-09-23 (×2): 240 mg via ORAL
  Filled 2020-09-22: qty 1
  Filled 2020-09-22: qty 2
  Filled 2020-09-22: qty 1

## 2020-09-22 MED ORDER — ATORVASTATIN CALCIUM 20 MG PO TABS
40.0000 mg | ORAL_TABLET | Freq: Every day | ORAL | Status: DC
  Administered 2020-09-22: 21:00:00 40 mg via ORAL
  Filled 2020-09-22: qty 2

## 2020-09-22 MED ORDER — INFLUENZA VAC A&B SA ADJ QUAD 0.5 ML IM PRSY
0.5000 mL | PREFILLED_SYRINGE | INTRAMUSCULAR | Status: AC
  Administered 2020-09-23: 17:00:00 0.5 mL via INTRAMUSCULAR
  Filled 2020-09-22 (×2): qty 0.5

## 2020-09-22 MED ORDER — LORAZEPAM 2 MG/ML IJ SOLN
2.0000 mg | Freq: Once | INTRAMUSCULAR | Status: AC
  Administered 2020-09-22: 21:00:00 2 mg via INTRAMUSCULAR
  Filled 2020-09-22: qty 1

## 2020-09-22 MED ORDER — HYDRALAZINE HCL 20 MG/ML IJ SOLN: 5.0000 mg | INTRAMUSCULAR | Status: DC | PRN

## 2020-09-22 MED ORDER — ACETAMINOPHEN 325 MG PO TABS: 650.0000 mg | ORAL_TABLET | Freq: Four times a day (QID) | ORAL | Status: DC | PRN

## 2020-09-22 MED ORDER — INSULIN ASPART 100 UNIT/ML ~~LOC~~ SOLN
0.0000 [IU] | Freq: Three times a day (TID) | SUBCUTANEOUS | Status: DC
  Administered 2020-09-22: 13:00:00 2 [IU] via SUBCUTANEOUS
  Filled 2020-09-22: qty 1

## 2020-09-22 MED ORDER — HALOPERIDOL LACTATE 5 MG/ML IJ SOLN: 1.0000 mg | Freq: Once | INTRAMUSCULAR | Status: DC

## 2020-09-22 MED ORDER — SODIUM CHLORIDE 0.9 % IV BOLUS
500.0000 mL | Freq: Once | INTRAVENOUS | Status: AC
  Administered 2020-09-22: 04:00:00 500 mL via INTRAVENOUS

## 2020-09-22 MED ORDER — HALOPERIDOL LACTATE 5 MG/ML IJ SOLN
1.0000 mg | Freq: Once | INTRAMUSCULAR | Status: AC
  Administered 2020-09-22: 10:00:00 1 mg via INTRAVENOUS
  Filled 2020-09-22: qty 1

## 2020-09-22 MED ORDER — GADOBUTROL 1 MMOL/ML IV SOLN
8.0000 mL | Freq: Once | INTRAVENOUS | Status: AC | PRN
  Administered 2020-09-22: 06:00:00 8 mL via INTRAVENOUS

## 2020-09-22 MED ORDER — LORATADINE 10 MG PO TABS
10.0000 mg | ORAL_TABLET | Freq: Every day | ORAL | Status: DC
  Administered 2020-09-23: 10:00:00 10 mg via ORAL
  Filled 2020-09-22: qty 1

## 2020-09-22 MED ORDER — INSULIN ASPART 100 UNIT/ML ~~LOC~~ SOLN: 0.0000 [IU] | Freq: Every day | SUBCUTANEOUS | Status: DC

## 2020-09-22 MED ORDER — ONDANSETRON HCL 4 MG/2ML IJ SOLN: 4.0000 mg | Freq: Three times a day (TID) | INTRAMUSCULAR | Status: DC | PRN

## 2020-09-22 NOTE — ED Notes (Signed)
Pt provided with breakfast tray.

## 2020-09-22 NOTE — ED Notes (Signed)
Pt not allowing staff to keep him on the heart monitor - MD aware

## 2020-09-22 NOTE — Evaluation (Signed)
Occupational Therapy Evaluation Patient Details Name: Samuel Carr MRN: [id number] DOB: 03/17/1930 Today's Date: 09/22/2020    History of Present Illness 86 y.o. male with medical history significant of diabetes mellitus, HTN, hyperlipidemia, hard of hearing, age-related memory loss, who presents with fall   Clinical Impression   Patient presenting with decreased I in self care, balance, functional mobility/transfers, endurance, and safety awareness. Pt is likely poor historian secondary to decreased cognition. No family present to confirm baseline. Pt reports he is independent with use of quad cane and living with son  PTA. Patient currently functioning at min guard - min A for safety and balance. Pt with very tangential speech and oriented to self only during session. If pt has 24/7 assistance he would benefit most from returning home with family and HHOT to address functional deficits.  Patient will benefit from acute OT to increase overall independence in the areas of ADLs, functional mobility, and safety awareness in order to safely discharge home.    Follow Up Recommendations  Home health OT;Supervision/Assistance - 24 hour    Equipment Recommendations  None recommended by OT       Precautions / Restrictions Precautions Precautions: Fall      Mobility Bed Mobility Overal bed mobility: Needs Assistance Bed Mobility: Sit to Supine;Supine to Sit     Supine to sit: Supervision Sit to supine: Supervision   General bed mobility comments: min cuing for technique    Transfers Overall transfer level: Needs assistance Equipment used: Quad cane Transfers: Sit to/from [employer] Sit to Stand: Supervision Stand pivot transfers: Min guard;Min assist            Balance Overall balance assessment: Needs assistance Sitting-balance support: Feet supported Sitting balance-Leahy Scale: Good     Standing balance support: During functional activity Standing  balance-Leahy Scale: Fair          ADL either performed or assessed with clinical judgement   ADL Overall ADL's : Needs assistance/impaired     Grooming: Wash/dry hands;Wash/dry face;Oral care;Standing;Min guard               Lower Body Dressing: Min guard;Sitting/lateral leans Lower Body Dressing Details (indicate cue type and reason): donning shoes Toilet Transfer: Hydrographic surveyor Details (indicate cue type and reason): min guard with use of quad cane         Functional mobility during ADLs: Min guard;Minimal assistance;Cane;Cueing for safety General ADL Comments: Pt needing min cuing for safety awareness and min guard - min A for ambulation/transfer with use of quad cane in room     Vision Baseline Vision/History: Wears glasses Wears Glasses: Reading only Patient Visual Report: No change from baseline              Pertinent Vitals/Pain Pain Assessment: Faces Faces Pain Scale: No hurt     Hand Dominance Right   Extremity/Trunk Assessment Upper Extremity Assessment Upper Extremity Assessment: Overall WFL for tasks assessed   Lower Extremity Assessment Lower Extremity Assessment: Defer to PT evaluation       Communication Communication Communication: HOH   Cognition Arousal/Alertness: Awake/alert Behavior During Therapy: Restless;Impulsive Overall Cognitive Status: No family/caregiver present to determine baseline cognitive functioning         General Comments: No family present. Pt with very tangential speech. Pt is oriented to self only this session and is pleasant.              Home Living Family/patient expects to be discharged to:: Private residence  Living Arrangements: Children Available Help at Discharge: Available 24 hours/day Type of Home: House                       Home Equipment: Krystal Clark - 2 wheels          Prior Functioning/Environment Level of Independence: Independent with assistive  device(s)        Comments: Pt is likely poor historian and no family present. Pt reports he is mod I with self care and use of quad cane. He is currently living with son.        OT Problem List: Decreased strength;Decreased coordination;Decreased activity tolerance;Decreased safety awareness;Impaired balance (sitting and/or standing);Decreased knowledge of use of DME or AE;Decreased knowledge of precautions;Decreased cognition      OT Treatment/Interventions: Self-care/ADL training;Therapeutic exercise;Therapeutic activities;Energy conservation;DME and/or AE instruction;Balance training;Patient/family education;Cognitive remediation/compensation    OT Goals(Current goals can be found in the care plan section) Acute Rehab OT Goals Patient Stated Goal: my son is going to come pick me up OT Goal Formulation: With patient Time For Goal Achievement: 10/06/20 Potential to Achieve Goals: Good ADL Goals Pt Will Perform Grooming: with modified independence;standing Pt Will Perform Lower Body Dressing: with modified independence;sit to/from stand Pt Will Transfer to Toilet: with modified independence;ambulating Pt Will Perform Toileting - Clothing Manipulation and hygiene: with modified independence;sit to/from stand  OT Frequency: Min 1X/week   Barriers to D/C:    none known at this time          AM-PAC OT "6 Clicks" Daily Activity     Outcome Measure Help from another person eating meals?: None Help from another person taking care of personal grooming?: None Help from another person toileting, which includes using toliet, bedpan, or urinal?: A Little Help from another person bathing (including washing, rinsing, drying)?: A Little Help from another person to put on and taking off regular upper body clothing?: None Help from another person to put on and taking off regular lower body clothing?: A Little 6 Click Score: 21   End of Session Equipment Utilized During Treatment: Other  (comment) (quad cane) Nurse Communication: Mobility status;Precautions  Activity Tolerance: Patient tolerated treatment well Patient left: in bed;with bed alarm set;with call bell/phone within reach  OT Visit Diagnosis: Muscle weakness (generalized) (M62.81);History of falling (Z91.81);Unsteadiness on feet (R26.81)                Time: 2878-6767 OT Time Calculation (min): 21 min Charges:  OT General Charges $OT Visit: 1 Visit OT Evaluation $OT Eval Low Complexity: 1 Low OT Treatments $Therapeutic Activity: 8-22 mins  Jackquline Denmark, MS, OTR/L , CBIS ascom [phone]  09/22/20, 1:06 PM

## 2020-09-22 NOTE — ED Notes (Signed)
Therapy at bedside.

## 2020-09-22 NOTE — ED Notes (Signed)
Pt placed in hallway in recliner with monitor so that sitter can watch him

## 2020-09-22 NOTE — ED Notes (Signed)
Pt in MRI.

## 2020-09-22 NOTE — ED Notes (Signed)
Patient continues to rip EKG stickers and blood pressure cuff off.

## 2020-09-22 NOTE — ED Notes (Signed)
Pt's family member updated - MD will speak with son

## 2020-09-22 NOTE — ED Notes (Signed)
Pt found sitting at end up bed, pt confused states he is ready to go and wants son to come pick him up. Pt pulled out IV and has pulled all monitoring devices off. Pt assisted back in to bed and reconnected to cardiac monitor, BP and pulseox at this time. Pts door open and visualized from nurses station.

## 2020-09-22 NOTE — ED Notes (Signed)
Pt continously getting out of bed, fall alarm in place, is unsuccessful in redirecting pt. Pt confused. Admitting notified and prn requested

## 2020-09-22 NOTE — Consult Note (Signed)
Cardiology Consultation:   Patient ID: Samuel Carr MRN: [id number]; DOB: June 11, 1930  Admit date: 09/21/2020 Date of Consult: 09/22/2020  Primary Care Provider: System, Provider Not In [facility] Cardiologist: No primary care provider on file.  [facility] Electrophysiologist:  None  Consult requested by Dr.: Clyde Lundborg Reason for consult: Near syncope, cardiac arrhythmia   Patient Profile:   Samuel Carr is a 86 y.o. male with a hx of dementia, diabetes type 2, hypertension, hyperlipidemia, presenting after a fall   History of Present Illness:   Samuel Carr  is a poor historian, most of the history obtained by the notes No family at the bedside Notes indicating son presented with the patient today after a fall Family heard a loud crash in the patient's room and went to investigate, he was found face down, was awake but confused He vomited x2, had bowel movement At the time reported some neck discomfort but was moving all his extremities, no sign of stroke with no focal neurologic deficits He denied any chest pain shortness of breath abdominal pain or cough  Note indicating history of memory loss, Having difficulty managing at home by himself with decline since November 2020, more confused He is a widower, living on his own, currently staying with his son but rotates with other family members  On my discussion with him today he has no complaints, would like to eat food, wonders why they we are taking so much blood  In the emergency room Covid negative, creatinine 1.43, CT scan neck negative, CT spine showing DJD, EKG showing normal sinus rhythm with PACs first-degree AV block   Past Medical History:  Diagnosis Date  . 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 Diabetes mellitus without complication (HCC)   . Hyperlipidemia     Past Surgical History:  Procedure Laterality Date  . CYST REMOVAL TRUNK       Home  Medications:  Prior to Admission medications   Medication Sig Start Date End Date Taking? Authorizing Provider  atorvastatin (LIPITOR) 40 MG tablet Take 40 mg by mouth daily.   Yes [provider]  diltiazem (TIAZAC) 240 MG 24 hr capsule Take 240 mg by mouth daily. 07/22/20  Yes [provider]  lisinopril (ZESTRIL) 2.5 MG tablet Take 2.5 mg by mouth daily. 07/22/20  Yes [provider]  loratadine (CLARITIN) 10 MG tablet Take 10 mg by mouth daily.   Yes [provider]  metFORMIN (GLUCOPHAGE) 1000 MG tablet Take 1,000 mg by mouth 2 (two) times daily with a meal.   Yes [provider]    Inpatient Medications: Scheduled Meds: . enoxaparin (LOVENOX) injection  40 mg Subcutaneous Q24H  . insulin aspart  0-5 Units Subcutaneous QHS  . insulin aspart  0-9 Units Subcutaneous TID WC   Continuous Infusions: . sodium chloride Stopped (09/22/20 0819)   PRN Meds: acetaminophen, hydrALAZINE  Allergies:   No Known Allergies  Social History:   Social History   Socioeconomic History  . Marital status: Widowed    Spouse name: Not on file  . Number of children: Not on file  . Years of education: Not on file  . Highest education level: Not on file  Occupational History  . Not on file  Tobacco Use  . Smoking status: Never Smoker  . Smokeless tobacco: Never Used  Substance and Sexual Activity  . Alcohol use: Never  . Drug use: Never  . Sexual activity: Not on  file  Other Topics Concern  . Not on file  Social History Narrative  . Not on file   Social Determinants of Health   Financial Resource Strain: Not on file  Food Insecurity: Not on file  Transportation Needs: Not on file  Physical Activity: Not on file  Stress: Not on file  Social Connections: Not on file  Intimate Partner Violence: Not on file    Family History:    Family History  Problem Relation Age of Onset  . Alzheimer's disease Sister   . Dementia Sister   . Cerebral  aneurysm Sister      ROS:  Please see the history of present illness.  Review of Systems  Constitutional: Negative.   HENT: Negative.   Respiratory: Negative.   Cardiovascular: Negative.   Gastrointestinal: Negative.   Musculoskeletal: Negative.   Neurological: Negative.   Psychiatric/Behavioral: Negative.   All other systems reviewed and are negative.    Physical Exam/Data:   Vitals:   09/22/20 0127 09/22/20 0300 09/22/20 0407 09/22/20 0611  BP: (!) 143/61 123/72 125/75 (!) 144/60  Pulse: 75 74 66 67  Resp: 15 15 16 15   Temp:      TempSrc:      SpO2: 100% 100% 99% 100%  Height:        Intake/Output Summary (Last 24 hours) at 09/22/2020 1208 Last data filed at 09/22/2020 0658 Gross per 24 hour  Intake 500 ml  Output --  Net 500 ml   No flowsheet data found.   There is no height or weight on file to calculate BMI.  General:  Well nourished, well developed, in no acute distress HEENT: normal Lymph: no adenopathy Neck: no JVD Endocrine:  No thryomegaly Vascular: No carotid bruits; FA pulses 2+ bilaterally without bruits  Cardiac:  normal S1, S2; RRR; no murmur  Lungs:  clear to auscultation bilaterally, no wheezing, rhonchi or rales  Abd: soft, nontender, no hepatomegaly  Ext: no edema Musculoskeletal:  No deformities, BUE and BLE strength normal and equal Skin: warm and dry  Neuro:  CNs 2-12 intact, no focal abnormalities noted Psych:  Normal affect   EKG:  The EKG was personally reviewed and demonstrates:   Normal sinus rhythm with rate 73 bpm left bundle branch block, first-degree AV block, PACs Unable to exclude secondary AV block type I  Telemetry:  Telemetry was personally reviewed and demonstrates:   Normal sinus rhythm  Relevant CV Studies: Echocardiogram pending  Laboratory Data:  High Sensitivity Troponin:   Recent Labs  Lab 09/22/20 0021 09/22/20 0250  TROPONINIHS 5 7     Chemistry Recent Labs  Lab 09/22/20 0250  NA 136  K 4.3   CL 103  CO2 20*  GLUCOSE 162*  BUN 22  CREATININE 1.43*  CALCIUM 9.0  GFRNONAA 47*  ANIONGAP 13    Recent Labs  Lab 09/22/20 0250  PROT 7.8  ALBUMIN 3.6  AST 23  ALT 13  ALKPHOS 81  BILITOT 0.6   Hematology Recent Labs  Lab 09/21/20 2228  WBC 10.2  RBC 3.65*  HGB 12.1*  HCT 35.9*  MCV 98.4  MCH 33.2  MCHC 33.7  RDW 14.6  PLT 276   BNP Recent Labs  Lab 09/21/20 2228  BNP 24.3    DDimer No results for input(s): DDIMER in the last 168 hours.   Radiology/Studies:  CT Head Wo Contrast  Result Date: 09/21/2020 CLINICAL DATA:  Head trauma unconscious EXAM: CT HEAD WITHOUT CONTRAST CT CERVICAL SPINE  WITHOUT CONTRAST TECHNIQUE: Multidetector CT imaging of the head and cervical spine was performed following the standard protocol without intravenous contrast. Multiplanar CT image reconstructions of the cervical spine were also generated. COMPARISON:  None. FINDINGS: CT HEAD FINDINGS Brain: No acute territorial infarction, hemorrhage, or intracranial mass. Moderate atrophy. Encephalomalacia within the bilateral frontal lobes. Moderate hypodensity in the white matter consistent with chronic small vessel ischemic change. Nondilated ventricles. Vascular: No hyperdense vessels.  Carotid vascular calcification Skull: Normal. Negative for fracture or focal lesion. Sinuses/Orbits: No acute finding. Other: None CT CERVICAL SPINE FINDINGS Alignment: Generalized straightening of the cervical spine. Trace retrolisthesis C6 on C7. Skull base and vertebrae: Vertebral body heights are maintained. There is no fracture identified. Poorly visible superior endplate at T1 with possible lytic lesion in the upper portion of the vertebral body. Soft tissues and spinal canal: No prevertebral fluid or swelling. No visible canal hematoma. Disc levels: Multiple level degenerative change with advanced disease at C6-C7 and C7-T1. Upper chest: Negative. Other: None IMPRESSION: 1. No CT evidence for acute  intracranial abnormality. Atrophy and chronic small vessel ischemic changes of the white matter. Encephalomalacia of the bilateral frontal lobes. 2. Poorly visible superior endplate at T1 with possible lytic lesion in the upper portion of the vertebral body. Consider further evaluation with MRI. 3. Straightening of the cervical spine with degenerative changes. No acute osseous abnormality. Electronically Signed   By: Jasmine PangKim  Fujinaga M.D.   On: 09/21/2020 23:17   CT Cervical Spine Wo Contrast  Result Date: 09/21/2020 CLINICAL DATA:  Head trauma unconscious EXAM: CT HEAD WITHOUT CONTRAST CT CERVICAL SPINE WITHOUT CONTRAST TECHNIQUE: Multidetector CT imaging of the head and cervical spine was performed following the standard protocol without intravenous contrast. Multiplanar CT image reconstructions of the cervical spine were also generated. COMPARISON:  None. FINDINGS: CT HEAD FINDINGS Brain: No acute territorial infarction, hemorrhage, or intracranial mass. Moderate atrophy. Encephalomalacia within the bilateral frontal lobes. Moderate hypodensity in the white matter consistent with chronic small vessel ischemic change. Nondilated ventricles. Vascular: No hyperdense vessels.  Carotid vascular calcification Skull: Normal. Negative for fracture or focal lesion. Sinuses/Orbits: No acute finding. Other: None CT CERVICAL SPINE FINDINGS Alignment: Generalized straightening of the cervical spine. Trace retrolisthesis C6 on C7. Skull base and vertebrae: Vertebral body heights are maintained. There is no fracture identified. Poorly visible superior endplate at T1 with possible lytic lesion in the upper portion of the vertebral body. Soft tissues and spinal canal: No prevertebral fluid or swelling. No visible canal hematoma. Disc levels: Multiple level degenerative change with advanced disease at C6-C7 and C7-T1. Upper chest: Negative. Other: None IMPRESSION: 1. No CT evidence for acute intracranial abnormality. Atrophy and  chronic small vessel ischemic changes of the white matter. Encephalomalacia of the bilateral frontal lobes. 2. Poorly visible superior endplate at T1 with possible lytic lesion in the upper portion of the vertebral body. Consider further evaluation with MRI. 3. Straightening of the cervical spine with degenerative changes. No acute osseous abnormality. Electronically Signed   By: Jasmine PangKim  Fujinaga M.D.   On: 09/21/2020 23:17   MR THORACIC SPINE W WO CONTRAST  Result Date: 09/22/2020 CLINICAL DATA:  86 year old male status post unwitnessed fall at home. Questionable T1 superior endplate lytic lesion on cervical spine CT last night. EXAM: MRI THORACIC WITHOUT AND WITH CONTRAST TECHNIQUE: Multiplanar and multiecho pulse sequences of the thoracic spine were obtained without and with intravenous contrast. CONTRAST:  8mL GADAVIST GADOBUTROL 1 MMOL/ML IV SOLN COMPARISON:  Cervical spine CT  and head CT 09/21/2020. FINDINGS: Limited cervical spine imaging: Stable to the cervical spine CT today, with retrolisthesis again noted C6 on C7. Thoracic spine segmentation:  Appears to be normal. Alignment: Exaggerated upper thoracic kyphosis (series 24, image 6). No thoracic spondylolisthesis. Vertebrae: Normal T1 and stir signal throughout the T1 vertebral body (series 24, image 6). No evidence of lytic or other focal vertebral lesion there. Likewise, marrow signal throughout the remaining thoracic vertebrae is within normal limits for age. There is faint degenerative posterior endplate marrow edema and enhancement at T11-T12 superimposed on advanced chronic degenerative endplate changes there. No suspicious marrow lesion. Cord: Axial images degraded by motion despite repeated imaging attempts. Thoracic spinal cord appears within normal limits for age. No cord expansion. No abnormal intradural enhancement. No dural thickening. Conus medullaris appears to be normal at T12-L1. Paraspinal and other soft tissues: Negative visible chest  and abdominal viscera aside from cardiomegaly. Thoracic lumbar paraspinal soft tissues are within normal limits. Disc levels: Age-appropriate thoracic spine degeneration. No significant thoracic spinal stenosis, despite disc and endplate degeneration with the levels T2-T3, T8-T9 and, and T11-T12 among the worst affected. IMPRESSION: 1. Negative for T1 vertebral lesion, artifact suspected on the earlier CT. No lytic lesion or suspicious marrow signal in the thoracic spine. 2. Age-appropriate thoracic spine degeneration. No thoracic spinal stenosis. Thoracic spinal cord within normal limits. Electronically Signed   By: Odessa Fleming M.D.   On: 09/22/2020 07:00     Assessment and Plan:   1. Near syncope Etiology unclear, possibly vasovagal in the setting of vomiting, bowel movement at the time Was unwitnessed, Notes do not indicate loss of consciousness Echocardiogram pending, We will recommend telemetry monitoring while inpatient then at discharge would place a ZIO monitor -EKG showing normal sinus rhythm, not atrial fibrillation, no indication for anticoagulation at this time --Consider checking orthostatics  Dementia Notes indicating failure to thrive over the past year, family rotating, helping to take care of him Needs evaluation by PT OT, likely high fall risk Currently in the ER, requiring close monitoring as he is confused  Cardiac arrhythmia Left bundle branch block, PACs, unable to exclude secondary AV block type I So far no evidence of atrial fibrillation We will continue to monitor on telemetry  Diabetes type 2 without complications Hemoglobin A1c pending On Metformin as outpatient  Vomiting/diarrhea Had symptoms at home around timing of near syncope Etiology unclear, will continue to monitor   Total encounter time more than 110 minutes  Greater than 50% was spent in counseling and coordination of care with the patient    For questions or updates, please contact [facility] Please consult [email or web address] for contact info under    Signed, Julien Nordmann, MD  09/22/2020 12:08 PM

## 2020-09-22 NOTE — ED Notes (Signed)
Unable to obtain vital signs as pt uncooperative

## 2020-09-22 NOTE — ED Notes (Signed)
Pt ambulatory to the bathroom with standby assist, pt with unsteady gait.

## 2020-09-22 NOTE — Evaluation (Signed)
Physical Therapy Evaluation Patient Details Name: Samuel Carr MRN: [id number] DOB: 30-Aug-1930 Today's Date: 09/22/2020   History of Present Illness  86 y.o. male with medical history significant of diabetes mellitus, HTN, hyperlipidemia, hard of hearing, age-related memory loss, who presents with fall and near syncope with unclear etiology. MRI of thoracic spine reported negative for T1 vertebral lesion. Head CT No CT evidence for acute intracranial abnormality, atrophy, chronic small vessel ischemic changes, encephalomalacia of the bilateral frontal lobes.   Clinical Impression  PT evaluation completed. Patient needs cues for redirection, attention to task, and extra time for following single step commands with baseline cognitive issues. Patient needs supervision assistance for transfers and cues for safety due to impulsive behavior. Patient ambulates with Min guard assistance using his own cane with flexed posture. Cues provided for safety with mobility due to cognitive concerns. No dizziness reported with standing activity or ambulation, however unable to get vitals as patient is not cooperative with telemetry. Patient participated with LE seated therapeutic exercises with visual demonstration and verbal cues for technique. Recommend PT to maximize independence and address functional limitations listed below. Patient will supervision at discharge for cognitive concerns and reportedly family is already providing this at baseline. If family is unable to provide supervision, consider short/long term placement.     Follow Up Recommendations Home health PT;Supervision/Assistance - 24 hour    Equipment Recommendations  None recommended by PT    Recommendations for Other Services       Precautions / Restrictions Precautions Precautions: Fall Precaution Comments: impulsive, confused Restrictions Weight Bearing Restrictions: No      Mobility  Bed Mobility Overal bed mobility: Needs  Assistance Bed Mobility: Sit to Supine;Supine to Sit     Supine to sit: Supervision Sit to supine: Supervision   General bed mobility comments: not observed as patient sitting up on arrival and post session    Transfers Overall transfer level: Needs assistance Equipment used: Quad cane Transfers: Sit to/from Stand Sit to Stand: Supervision Stand pivot transfers: Supervision       General transfer comment: multiple transfers performed throughout session from recliner chiar and from stretcher. patient needs close supervision due to impulsive behavior and confusion. no physical assistance is required for transfers  Ambulation/Gait Ambulation/Gait assistance: Min guard Gait Distance (Feet): 15 Feet Assistive device: Quad cane (patient's own cane used) Gait Pattern/deviations: Trunk flexed Gait velocity: decreased   General Gait Details: patient required min guard for safety concerns due to cognition and difficulty following commands without redirection. patient has flexed posture and needs cues for safety.  Stairs            Wheelchair Mobility    Modified Rankin (Stroke Patients Only)       Balance Overall balance assessment: Needs assistance Sitting-balance support: Feet supported Sitting balance-Leahy Scale: Good Sitting balance - Comments: good static and dynamic sitting balance demonstrated   Standing balance support: During functional activity;Single extremity supported Standing balance-Leahy Scale: Fair Standing balance comment: with cane used for unilateral UE support in RUE                             Pertinent Vitals/Pain Pain Assessment: No/denies pain Faces Pain Scale: No hurt    Home Living Family/patient expects to be discharged to:: Private residence Living Arrangements: Children Available Help at Discharge: Available 24 hours/day Type of Home: House         Home Equipment: Krystal Clark -  2 wheels      Prior Function  Level of Independence: Independent with assistive device(s)         Comments: patient is a poor historian. per notes, patient used quad cane for ambulation and currently lives with his son     Hand Dominance   Dominant Hand: Right    Extremity/Trunk Assessment   Upper Extremity Assessment Upper Extremity Assessment: Overall WFL for tasks assessed    Lower Extremity Assessment Lower Extremity Assessment: Generalized weakness (unable to follow commands for formal MMT)       Communication   Communication: HOH  Cognition Arousal/Alertness: Awake/alert Behavior During Therapy: Restless;Impulsive Overall Cognitive Status: History of cognitive impairments - at baseline                                 General Comments: patient oriented to self only. patient is able to follow single step commands with constant cues for redirection and attention to task.      General Comments      Exercises General Exercises - Lower Extremity Long Arc Quad: AROM;Strengthening;Both;10 reps;Seated Hip Flexion/Marching: AROM;Strengthening;Both;10 reps;Seated Other Exercises Other Exercises: patient needs extra time and verbal cues/demonstration for exercise technique   Assessment/Plan    PT Assessment Patient needs continued PT services  PT Problem List Decreased strength;Decreased activity tolerance;Decreased balance;Decreased mobility;Decreased cognition;Decreased knowledge of use of DME;Decreased safety awareness       PT Treatment Interventions DME instruction;Gait training;Stair training;Functional mobility training;Therapeutic activities;Therapeutic exercise;Balance training;Neuromuscular re-education;Cognitive remediation;Patient/family education    PT Goals (Current goals can be found in the Care Plan section)  Acute Rehab PT Goals Patient Stated Goal: to leave PT Goal Formulation: With patient Time For Goal Achievement: 10/06/20 Potential to Achieve Goals: Fair     Frequency Min 2X/week   Barriers to discharge        Co-evaluation               AM-PAC PT "6 Clicks" Mobility  Outcome Measure Help needed turning from your back to your side while in a flat bed without using bedrails?: A Little Help needed moving from lying on your back to sitting on the side of a flat bed without using bedrails?: A Little Help needed moving to and from a bed to a chair (including a wheelchair)?: A Little Help needed standing up from a chair using your arms (e.g., wheelchair or bedside chair)?: A Little Help needed to walk in hospital room?: A Little Help needed climbing 3-5 steps with a railing? : A Little 6 Click Score: 18    End of Session   Activity Tolerance: Patient tolerated treatment well Patient left: in chair;with call bell/phone within reach;with nursing/sitter in room Nurse Communication: Mobility status PT Visit Diagnosis: History of falling (Z91.81);Unsteadiness on feet (R26.81);Muscle weakness (generalized) (M62.81)    Time: 7672-0947 PT Time Calculation (min) (ACUTE ONLY): 14 min   Charges:   PT Evaluation $PT Eval Moderate Complexity: 1 Mod PT Treatments $Therapeutic Exercise: 8-22 mins        Donna Bernard, PT, MPT   Ina Homes 09/22/2020, 2:38 PM

## 2020-09-22 NOTE — ED Provider Notes (Signed)
[facility] Emergency Department Provider Note  ____________________________________________   Event Date/Time   First MD Initiated Contact with Patient 09/21/20 2330     (approximate)  I have reviewed the triage vital signs and the nursing notes.   HISTORY  Chief Complaint Fall  Level 5 caveat:  history/ROS limited by age associated memory issues that most likely represent undiagnosed dementia.  He is also hard of hearing.  Much of the history is provided by the patient's son.   HPI Samuel Carr is a 86 y.o. male with somewhat unclear medical history but thought to be as listed below who presents by private vehicle with his son with whom he is currently living for evaluation after an episode tonight.  The son reports that the patient was in his room and they heard a loud crash and went to investigate.  The patient was unconscious, facedown on the floor.  They turned him over and he seemed to be awake but was not really responding to them.  They are able to get him up to a seated position and he had an episode of vomiting.  They were in the process of trying to clean them up from the vomiting when he lost control of his bowels.  They got him cleaned up from that and he was responding a little bit more to them but apparently had another episode of vomiting.  He was then more back to normal and said that he felt little bit sore and was rubbing the back of his neck and also said that his leg hurt a little bit.  However they were able to get him dressed and he was able to climb up into the son's truck on his own.  The patient currently says he feels fine.  It is unclear if he remembers what happened.  The son and his 3 siblings have been sharing care duties of the patient who is a widower and has not been doing well living on his own.  He is currently staying with the son but he rotates the other family members.  [place] are not available through Epic.  The patient denies chest pain, shortness of breath, and abdominal pain.  He said he is no longer nauseated.  He is currently denying neck pain.  He is not sure what happened earlier.  The onset was acute and severe and nothing in particular made it better or worse.  He is not on blood thinners.        Past Medical History:  Diagnosis Date  . 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 Diabetes mellitus without complication (HCC)   . Hyperlipidemia     There are no problems to display for this patient.   Past Surgical History:  Procedure Laterality Date  . CYST REMOVAL TRUNK      Prior to Admission medications   Medication Sig Start Date End Date Taking? Authorizing Provider  atorvastatin (LIPITOR) 40 MG tablet Take 40 mg by mouth daily.   Yes [provider]  loratadine (CLARITIN) 10 MG tablet Take 10 mg by mouth daily.   Yes [provider]  metFORMIN (GLUCOPHAGE) 1000 MG tablet Take 1,000 mg by mouth 2 (two) times daily with a meal.   Yes [provider]    Allergies Patient has no known allergies.  History reviewed. No pertinent family history.  Social  History Social History   Tobacco Use  . Smoking status: Never Smoker  . Smokeless tobacco: Never Used  Substance Use Topics  . Alcohol use: Never  . Drug use: Never    Review of Systems Level 5 caveat:  history/ROS limited by age associated memory issues that most likely represent undiagnosed dementia.  He is also hard of hearing.  Much of the history is provided by the patient's son.    Constitutional: No fever/chills Eyes: No visual changes. ENT: No sore throat. Cardiovascular: Syncope and collapse.  Denies chest pain. Respiratory: Denies shortness of breath. Gastrointestinal: No abdominal pain.  Apparently 2 episodes of vomiting .  Involuntary loss of bowel control. Genitourinary: Negative for  dysuria. Musculoskeletal: Negative for neck pain.  Negative for back pain. Integumentary: Negative for rash. Neurological: Negative for headaches, focal weakness or numbness.   ____________________________________________   PHYSICAL EXAM:  VITAL SIGNS: ED Triage Vitals  Enc Vitals Group     BP 09/21/20 2221 (!) 121/52     Pulse Rate 09/21/20 2221 65     Resp 09/21/20 2221 16     Temp 09/21/20 2221 97.7 F (36.5 C)     Temp Source 09/21/20 2221 Oral     SpO2 09/21/20 2221 98 %     Weight --      Height 09/21/20 2222 1.778 m (5\' 10" )     Head Circumference --      Peak Flow --      Pain Score --      Pain Loc --      Pain Edu? --      Excl. in GC? --     Constitutional: Alert and oriented to self and location, knows and is able to identify his son. Eyes: Conjunctivae are normal.  Head: Atraumatic. Nose: No congestion/rhinnorhea. Mouth/Throat: Patient is wearing a mask. Neck: No stridor.  No meningeal signs.   Cardiovascular: Normal rate, irregular rhythm. Good peripheral circulation. Respiratory: Normal respiratory effort.  No retractions. Gastrointestinal: Soft and nontender. No distention.  Musculoskeletal: No lower extremity tenderness nor edema. No gross deformities of extremities.  Normal range of motion, weightbearing without difficulty. Neurologic:  Normal speech and language. No gross focal neurologic deficits are appreciated other than being hard of hearing.  However he does seem to confabulate a bed; it is obvious upon talking to him that he is a bit confused and does not remember events from earlier tonight. Skin:  Skin is warm, dry and intact.   ____________________________________________   LABS (all labs ordered are listed, but only abnormal results are displayed)  Labs Reviewed  CBC - Abnormal; Notable for the following components:      Result Value   RBC 3.65 (*)    Hemoglobin 12.1 (*)    HCT 35.9 (*)    All other components within normal limits   COMPREHENSIVE METABOLIC PANEL - Abnormal; Notable for the following components:   CO2 20 (*)    Glucose, Bld 162 (*)    Creatinine, Ser 1.43 (*)    GFR, Estimated 47 (*)    All other components within normal limits  RESP PANEL BY RT-PCR (FLU A&B, COVID) ARPGX2  URINE CULTURE  PROTIME-INR  APTT  MAGNESIUM  URINALYSIS, COMPLETE (UACMP) WITH MICROSCOPIC  TROPONIN I (HIGH SENSITIVITY)  TROPONIN I (HIGH SENSITIVITY)   ____________________________________________  EKG  ED ECG REPORT I, Loleta Roseory Karrie Fluellen, the attending physician, personally viewed and interpreted this ECG.  Date: 09/21/2020 EKG Time: 22: 12 Rate: 73  Rhythm: Atrial fibrillation with competing junctional pacemaker QRS Axis: normal Intervals: normal ST/T Wave abnormalities: ST depression in leads II, III, and aVF as well as notched appearance of T waves in leads V5 and V6, could represent acute ischemia or could represent chronic changes. Narrative Interpretation: no definitive evidence of acute ischemia; does not meet STEMI criteria.   ____________________________________________  RADIOLOGY I, Loleta Rose, personally viewed and evaluated these images (plain radiographs) as part of my medical decision making, as well as reviewing the written report by the radiologist.  I also discussed the abnormal results of the cervical spine imaging with the radiologist by phone.  ED MD interpretation: Brain atrophy but no acute findings on the CT head.  Possible lytic lesion on T1 for which the radiologist recommends additional imaging with an MRI with and without contrast if possible or without contrast if GFR does not support gadolinium.  Official radiology report(s): CT Head Wo Contrast  Result Date: 09/21/2020 CLINICAL DATA:  Head trauma unconscious EXAM: CT HEAD WITHOUT CONTRAST CT CERVICAL SPINE WITHOUT CONTRAST TECHNIQUE: Multidetector CT imaging of the head and cervical spine was performed following the standard protocol  without intravenous contrast. Multiplanar CT image reconstructions of the cervical spine were also generated. COMPARISON:  None. FINDINGS: CT HEAD FINDINGS Brain: No acute territorial infarction, hemorrhage, or intracranial mass. Moderate atrophy. Encephalomalacia within the bilateral frontal lobes. Moderate hypodensity in the white matter consistent with chronic small vessel ischemic change. Nondilated ventricles. Vascular: No hyperdense vessels.  Carotid vascular calcification Skull: Normal. Negative for fracture or focal lesion. Sinuses/Orbits: No acute finding. Other: None CT CERVICAL SPINE FINDINGS Alignment: Generalized straightening of the cervical spine. Trace retrolisthesis C6 on C7. Skull base and vertebrae: Vertebral body heights are maintained. There is no fracture identified. Poorly visible superior endplate at T1 with possible lytic lesion in the upper portion of the vertebral body. Soft tissues and spinal canal: No prevertebral fluid or swelling. No visible canal hematoma. Disc levels: Multiple level degenerative change with advanced disease at C6-C7 and C7-T1. Upper chest: Negative. Other: None IMPRESSION: 1. No CT evidence for acute intracranial abnormality. Atrophy and chronic small vessel ischemic changes of the white matter. Encephalomalacia of the bilateral frontal lobes. 2. Poorly visible superior endplate at T1 with possible lytic lesion in the upper portion of the vertebral body. Consider further evaluation with MRI. 3. Straightening of the cervical spine with degenerative changes. No acute osseous abnormality. Electronically Signed   By: Jasmine Pang M.D.   On: 09/21/2020 23:17   CT Cervical Spine Wo Contrast  Result Date: 09/21/2020 CLINICAL DATA:  Head trauma unconscious EXAM: CT HEAD WITHOUT CONTRAST CT CERVICAL SPINE WITHOUT CONTRAST TECHNIQUE: Multidetector CT imaging of the head and cervical spine was performed following the standard protocol without intravenous contrast.  Multiplanar CT image reconstructions of the cervical spine were also generated. COMPARISON:  None. FINDINGS: CT HEAD FINDINGS Brain: No acute territorial infarction, hemorrhage, or intracranial mass. Moderate atrophy. Encephalomalacia within the bilateral frontal lobes. Moderate hypodensity in the white matter consistent with chronic small vessel ischemic change. Nondilated ventricles. Vascular: No hyperdense vessels.  Carotid vascular calcification Skull: Normal. Negative for fracture or focal lesion. Sinuses/Orbits: No acute finding. Other: None CT CERVICAL SPINE FINDINGS Alignment: Generalized straightening of the cervical spine. Trace retrolisthesis C6 on C7. Skull base and vertebrae: Vertebral body heights are maintained. There is no fracture identified. Poorly visible superior endplate at T1 with possible lytic lesion in the upper portion of the vertebral body. Soft tissues and  spinal canal: No prevertebral fluid or swelling. No visible canal hematoma. Disc levels: Multiple level degenerative change with advanced disease at C6-C7 and C7-T1. Upper chest: Negative. Other: None IMPRESSION: 1. No CT evidence for acute intracranial abnormality. Atrophy and chronic small vessel ischemic changes of the white matter. Encephalomalacia of the bilateral frontal lobes. 2. Poorly visible superior endplate at T1 with possible lytic lesion in the upper portion of the vertebral body. Consider further evaluation with MRI. 3. Straightening of the cervical spine with degenerative changes. No acute osseous abnormality. Electronically Signed   By: Jasmine Pang M.D.   On: 09/21/2020 23:17    ____________________________________________   PROCEDURES   Procedure(s) performed (including Critical Care):  Procedures   ____________________________________________   INITIAL IMPRESSION / MDM / ASSESSMENT AND PLAN / ED COURSE  As part of my medical decision making, I reviewed the following data within the electronic  MEDICAL RECORD NUMBER History obtained from family, Nursing notes reviewed and incorporated, Labs reviewed , EKG interpreted , Old chart reviewed, Discussed with admitting physician  and Notes from prior ED visits   Differential diagnosis includes, but is not limited to, cardiogenic syncope, potentially fatal arrhythmia, electrolyte or metabolic abnormality, renal failure, acute intracranial hemorrhage, seizure, metastatic disease, hypoglycemia.  The patient is on the cardiac monitor to evaluate for evidence of arrhythmia and/or significant heart rate changes.  Patient's vital signs are stable.  He is in no distress.  However his EKG shows atrial fibrillation and the family and patient are unaware of any prior history of A. fib.  History is limited at this time and no additional medical records are available other than a brief perioperative note from a skin procedure but it does not go into his medical history other than his medications.  It appears that he may have been on diltiazem at a time but it is unknown whether this was due to hypertension or atrial fibrillation with the family is unaware of an A. fib diagnosis and he is not on anticoagulation.  I am concerned about the severity of the episode and the possibility this could have been the result of a potentially dangerous cardiac arrhythmia.  Seizure is also possible but I think less likely than a cardiogenic event that led to transient poor cerebral perfusion and a brief episode of altered mental status.  The patient has difficult IV access and the nursing staff is working on it.  CBC is normal but the rest of his labs are pending.  I discussed the possible lytic lesion of T1 with the radiologist by phone and she recommended MRI with and without contrast if possible for further characterization, MRI without contrast if GFR does not support the contrast imaging.  I updated the son about my plan to admit the patient at least for observation given the  possible new onset A. fib and syncope that is not low risk based on [place] syncope rules.  The son agrees with the plan.  MRI and labs are pending, but I will speak with the hospitalist after the rest of the lab work is back.     Clinical Course as of 09/22/20 0643  Thu Sep 22, 2020  0219 Troponin I (High Sensitivity): 5 Normal troponin, but CMP hemolyzed.  Lab is sending someone else to draw blood. [CF]  0345 Comprehensive metabolic panel shows slightly elevated creatinine which could represent AKI but most likely represents some degree of chronic kidney injury.  I ordered 500 mL normal saline bolus.  Rest of labs are reassuring with no obvious acute abnormality.  Patient is COVID-19 negative.  As previously described I am consulting the hospitalist for admission given the syncopal episode with transient altered mental status, nausea vomiting, and bowel incontinence.  MRI of the thoracic spine is pending. [CF]  0410 Troponin I (High Sensitivity): 7 [CF]  0437 Discussed case in person with Dr. Sedalia Muta with the hospitalist service, and she will admit. [CF]    Clinical Course User Index [CF] Loleta Rose, MD     ____________________________________________  FINAL CLINICAL IMPRESSION(S) / ED DIAGNOSES  Final diagnoses:  Syncope and collapse  Non-intractable vomiting with nausea, unspecified vomiting type  Transient alteration of awareness     MEDICATIONS GIVEN DURING THIS VISIT:  Medications  sodium chloride 0.9 % bolus 500 mL (500 mLs Intravenous New Bag/Given 09/22/20 0333)  gadobutrol (GADAVIST) 1 MMOL/ML injection 8 mL (8 mLs Intravenous Contrast Given 09/22/20 0608)     ED Discharge Orders    None      *Please note:  Samuel Carr was evaluated in Emergency Department on 09/22/2020 for the symptoms described in the history of present illness. He was evaluated in the context of the global COVID-19 pandemic, which necessitated consideration that the patient might be  at risk for infection with the SARS-CoV-2 virus that causes COVID-19. Institutional protocols and algorithms that pertain to the evaluation of patients at risk for COVID-19 are in a state of rapid change based on information released by regulatory bodies including the CDC and federal and state organizations. These policies and algorithms were followed during the patient's care in the ED.  Some ED evaluations and interventions may be delayed as a result of limited staffing during and after the pandemic.*  Note:  This document was prepared using Dragon voice recognition software and may include unintentional dictation errors.   Loleta Rose, MD 09/22/20 [phone]

## 2020-09-22 NOTE — ED Notes (Signed)
This RN to bedside for bed alarm ringing. Pt found to be attempting to get out of bed. Pt found to have also taken out IV placed by IV team. Iv taped to bed rail. When asked what happen pt states he does not know what happened to it but he didn't do it

## 2020-09-22 NOTE — ED Notes (Signed)
Pt offered lunch tray and wanted nothing but fruit cup.

## 2020-09-22 NOTE — ED Notes (Signed)
Pt on bed alarm, continuously trying to get out of bed. IV team at bedside

## 2020-09-22 NOTE — H&P (Addendum)
History and Physical    Samuel Carr VCB:[id number] DOB: 1930-03-03 DOA: 09/21/2020  Referring MD/NP/PA:   PCP: System, Provider Not In   Patient coming from:  The patient is coming from home.  At baseline, pt is partially dependent for most of ADL.        Chief Complaint: fall and LOC  HPI: Samuel Carr is a 86 y.o. male with medical history significant of diabetes mellitus, HTN, hyperlipidemia, hard of hearing, age-related memory loss, who presents with fall  Per his son (I called his son by phone), patient was in his room and they heard a loud crash and went to investigate.  The patient was unconscious, facedown on the floor.  They turned him over and he seemed to be awake but was not really responding to them. Pt vomited twice and had loss of control of his bowels. Pt complains of neck pain.  He moves all extremities.  No facial droop or slurred speech.  Per his son, patient does not have chest pain, shortness breath, cough, abdominal pain.  Currently patient does not have active nausea or vomiting.  Per his son, patient has history of age-related memory loss.  At his normal baseline, patient usually recognizes family members, intermittently orientated to time and place.  Today patient seems to be more confused.  Per his son, patient mental status has been declining since November 2020.  Pt is is a widower and has not been doing well living on his own.  He is currently staying with the son but he rotates the other family members.   ED Course: pt was found to have WBC 10.2, INR 1.0, PTT 25, troponin level 5, 7, negative Covid PCR, AKI with creatinine 1.43, BUN 22 (creatinine 1.08 on 08/13/2020), temperature 97.7, blood pressure 144/60, heart rate 67, RR 16, oxygen saturation 98% on room air.  CT head is negative for acute intracranial abnormalities.  CT of C-spine showed degenerative disc disease and possible T1 lytic lesion, but MRI of thoracic spine showed negative T1 lesion.   Patient is placed on MedSurg bed for observation.  Cardiology, Dr. Mariah Milling is consulted.  Review of Systems: Could not be reviewed accurately due to altered mental status.   Allergy: No Known Allergies  Past Medical History:  Diagnosis Date  . 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 Diabetes mellitus without complication (HCC)   . Hyperlipidemia     Past Surgical History:  Procedure Laterality Date  . CYST REMOVAL TRUNK      Social History:  reports that he has never smoked. He has never used smokeless tobacco. He reports that he does not drink alcohol and does not use drugs.  Family History:  Family History  Problem Relation Age of Onset  . Alzheimer's disease Sister   . Dementia Sister   . Cerebral aneurysm Sister      Prior to Admission medications   Medication Sig Start Date End Date Taking? Authorizing Provider  atorvastatin (LIPITOR) 40 MG tablet Take 40 mg by mouth daily.   Yes [provider]  loratadine (CLARITIN) 10 MG tablet Take 10 mg by mouth daily.   Yes [provider]  metFORMIN (GLUCOPHAGE) 1000 MG tablet Take 1,000 mg by mouth 2 (two) times daily with a meal.   Yes [provider]    Physical Exam: Vitals:   09/22/20 0127 09/22/20 0300 09/22/20 0407 09/22/20 0611  BP: (!) 143/61 123/72 125/75 (!) 144/60  Pulse: 75 74 66 67  Resp: Temp:      TempSrc:      SpO2: 100% 100% 99% 100%  Height:       General: Not in acute distress HEENT:       Eyes: PERRL, EOMI, no scleral icterus.       ENT: No discharge from the ears and nose       Neck: No JVD, no bruit, no mass felt. Heme: No neck lymph node enlargement. Cardiac: S1/S2, irregular, No murmurs, No gallops or rubs. Respiratory: No rales, wheezing, rhonchi or rubs. GI: Soft, nondistended, nontender, no organomegaly, BS present. GU: No hematuria Ext: No pitting leg edema bilaterally. 1+DP/PT pulse  bilaterally. Musculoskeletal: No joint deformities, No joint redness or warmth, no limitation of ROM in spin. Skin: No rashes.  Neuro: confused, knows his own name, but is not oriented to place and time, cranial nerves II-XII grossly intact, moves all extremities Psych: Patient is not psychotic, no suicidal or hemocidal ideation.  Labs on Admission: I have personally reviewed following labs and imaging studies  CBC: Recent Labs  Lab 09/21/20 2228  WBC 10.2  HGB 12.1*  HCT 35.9*  MCV 98.4  PLT 276   Basic Metabolic Panel: Recent Labs  Lab 09/22/20 0250  NA 136  K 4.3  CL 103  CO2 20*  GLUCOSE 162*  BUN 22  CREATININE 1.43*  CALCIUM 9.0  MG 1.7   GFR: CrCl cannot be calculated (Unknown ideal weight.). Liver Function Tests: Recent Labs  Lab 09/22/20 0250  AST 23  ALT 13  ALKPHOS 81  BILITOT 0.6  PROT 7.8  ALBUMIN 3.6   Recent Labs  Lab 09/22/20 0250  LIPASE 25   No results for input(s): AMMONIA in the last 168 hours. Coagulation Profile: Recent Labs  Lab 09/22/20 0021  INR 1.0   Cardiac Enzymes: Recent Labs  Lab 09/22/20 0250  CKTOTAL 113   BNP (last 3 results) No results for input(s): PROBNP in the last 8760 hours. HbA1C: No results for input(s): HGBA1C in the last 72 hours. CBG: Recent Labs  Lab 09/22/20 0936  GLUCAP 114*   Lipid Profile: No results for input(s): CHOL, HDL, LDLCALC, TRIG, CHOLHDL, LDLDIRECT in the last 72 hours. Thyroid Function Tests: Recent Labs    09/22/20 0250  TSH 3.732   Anemia Panel: No results for input(s): VITAMINB12, FOLATE, FERRITIN, TIBC, IRON, RETICCTPCT in the last 72 hours. Urine analysis:    Component Value Date/Time   LABSPEC >=1.030 08/13/2020 1702   PHURINE 5.5 08/13/2020 1702   GLUCOSEU NEGATIVE 08/13/2020 1702   HGBUR TRACE (A) 08/13/2020 1702   BILIRUBINUR SMALL (A) 08/13/2020 1702   KETONESUR NEGATIVE 08/13/2020 1702   PROTEINUR 100 (A) 08/13/2020 1702   UROBILINOGEN 1.0 08/13/2020 1702    NITRITE NEGATIVE 08/13/2020 1702   LEUKOCYTESUR NEGATIVE 08/13/2020 1702   Sepsis Labs: (procalcitonin:4,lacticidven:4) ) Recent Results (from the past 240 hour(s))  Resp Panel by RT-PCR (Flu A&B, Covid) Nasopharyngeal Swab     Status: None   Collection Time: 09/22/20 12:21 AM   Specimen: Nasopharyngeal Swab; Nasopharyngeal(NP) swabs in vial transport medium  Result Value Ref Range Status   SARS Coronavirus 2 by RT PCR NEGATIVE NEGATIVE Final    Comment: (NOTE) SARS-CoV-2 target nucleic acids are NOT DETECTED.  The SARS-CoV-2 RNA is generally detectable in upper respiratory specimens during the acute phase of infection. The lowest concentration of SARS-CoV-2 viral copies this assay can detect is  138 copies/mL. A negative result does not preclude SARS-Cov-2 infection and should not be used as the sole basis for treatment or other patient management decisions. A negative result may occur with  improper specimen collection/handling, submission of specimen other than nasopharyngeal swab, presence of viral mutation(s) within the areas targeted by this assay, and inadequate number of viral copies(<138 copies/mL). A negative result must be combined with clinical observations, patient history, and epidemiological information. The expected result is Negative.  Fact Sheet for Patients:  BloggerCourse.comhttps://www.fda.gov/media/152166/download  Fact Sheet for Healthcare Providers:  SeriousBroker.ithttps://www.fda.gov/media/152162/download  This test is no t yet approved or cleared by the [place] FDA and  has been authorized for detection and/or diagnosis of SARS-CoV-2 by FDA under an Emergency Use Authorization (EUA). This EUA will remain  in effect (meaning this test can be used) for the duration of the COVID-19 declaration under Section 564(b)(1) of the Act, 21 U.S.C.section 360bbb-3(b)(1), unless the authorization is terminated  or revoked sooner.       Influenza A by PCR NEGATIVE NEGATIVE  Final   Influenza B by PCR NEGATIVE NEGATIVE Final    Comment: (NOTE) The Xpert Xpress SARS-CoV-2/FLU/RSV plus assay is intended as an aid in the diagnosis of influenza from Nasopharyngeal swab specimens and should not be used as a sole basis for treatment. Nasal washings and aspirates are unacceptable for Xpert Xpress SARS-CoV-2/FLU/RSV testing.  Fact Sheet for Patients: BloggerCourse.comhttps://www.fda.gov/media/152166/download  Fact Sheet for Healthcare Providers: SeriousBroker.ithttps://www.fda.gov/media/152162/download  This test is not yet approved or cleared by the [place] FDA and has been authorized for detection and/or diagnosis of SARS-CoV-2 by FDA under an Emergency Use Authorization (EUA). This EUA will remain in effect (meaning this test can be used) for the duration of the COVID-19 declaration under Section 564(b)(1) of the Act, 21 U.S.C. section 360bbb-3(b)(1), unless the authorization is terminated or revoked.  Performed at [facility], [address] [place], [place] [address]      Radiological Exams on Admission: CT Head Wo Contrast  Result Date: 09/21/2020 CLINICAL DATA:  Head trauma unconscious EXAM: CT HEAD WITHOUT CONTRAST CT CERVICAL SPINE WITHOUT CONTRAST TECHNIQUE: Multidetector CT imaging of the head and cervical spine was performed following the standard protocol without intravenous contrast. Multiplanar CT image reconstructions of the cervical spine were also generated. COMPARISON:  None. FINDINGS: CT HEAD FINDINGS Brain: No acute territorial infarction, hemorrhage, or intracranial mass. Moderate atrophy. Encephalomalacia within the bilateral frontal lobes. Moderate hypodensity in the white matter consistent with chronic small vessel ischemic change. Nondilated ventricles. Vascular: No hyperdense vessels.  Carotid vascular calcification Skull: Normal. Negative for fracture or focal lesion. Sinuses/Orbits: No acute finding. Other: None CT CERVICAL SPINE FINDINGS Alignment:  Generalized straightening of the cervical spine. Trace retrolisthesis C6 on C7. Skull base and vertebrae: Vertebral body heights are maintained. There is no fracture identified. Poorly visible superior endplate at T1 with possible lytic lesion in the upper portion of the vertebral body. Soft tissues and spinal canal: No prevertebral fluid or swelling. No visible canal hematoma. Disc levels: Multiple level degenerative change with advanced disease at C6-C7 and C7-T1. Upper chest: Negative. Other: None IMPRESSION: 1. No CT evidence for acute intracranial abnormality. Atrophy and chronic small vessel ischemic changes of the white matter. Encephalomalacia of the bilateral frontal lobes. 2. Poorly visible superior endplate at T1 with possible lytic lesion in the upper portion of the vertebral body. Consider further evaluation with MRI. 3. Straightening of the cervical spine with degenerative changes. No acute osseous abnormality. Electronically  Signed   By: Jasmine Pang M.D.   On: 09/21/2020 23:17   CT Cervical Spine Wo Contrast  Result Date: 09/21/2020 CLINICAL DATA:  Head trauma unconscious EXAM: CT HEAD WITHOUT CONTRAST CT CERVICAL SPINE WITHOUT CONTRAST TECHNIQUE: Multidetector CT imaging of the head and cervical spine was performed following the standard protocol without intravenous contrast. Multiplanar CT image reconstructions of the cervical spine were also generated. COMPARISON:  None. FINDINGS: CT HEAD FINDINGS Brain: No acute territorial infarction, hemorrhage, or intracranial mass. Moderate atrophy. Encephalomalacia within the bilateral frontal lobes. Moderate hypodensity in the white matter consistent with chronic small vessel ischemic change. Nondilated ventricles. Vascular: No hyperdense vessels.  Carotid vascular calcification Skull: Normal. Negative for fracture or focal lesion. Sinuses/Orbits: No acute finding. Other: None CT CERVICAL SPINE FINDINGS Alignment: Generalized straightening of the  cervical spine. Trace retrolisthesis C6 on C7. Skull base and vertebrae: Vertebral body heights are maintained. There is no fracture identified. Poorly visible superior endplate at T1 with possible lytic lesion in the upper portion of the vertebral body. Soft tissues and spinal canal: No prevertebral fluid or swelling. No visible canal hematoma. Disc levels: Multiple level degenerative change with advanced disease at C6-C7 and C7-T1. Upper chest: Negative. Other: None IMPRESSION: 1. No CT evidence for acute intracranial abnormality. Atrophy and chronic small vessel ischemic changes of the white matter. Encephalomalacia of the bilateral frontal lobes. 2. Poorly visible superior endplate at T1 with possible lytic lesion in the upper portion of the vertebral body. Consider further evaluation with MRI. 3. Straightening of the cervical spine with degenerative changes. No acute osseous abnormality. Electronically Signed   By: Jasmine Pang M.D.   On: 09/21/2020 23:17   MR THORACIC SPINE W WO CONTRAST  Result Date: 09/22/2020 CLINICAL DATA:  86 year old male status post unwitnessed fall at home. Questionable T1 superior endplate lytic lesion on cervical spine CT last night. EXAM: MRI THORACIC WITHOUT AND WITH CONTRAST TECHNIQUE: Multiplanar and multiecho pulse sequences of the thoracic spine were obtained without and with intravenous contrast. CONTRAST:  53mL GADAVIST GADOBUTROL 1 MMOL/ML IV SOLN COMPARISON:  Cervical spine CT and head CT 09/21/2020. FINDINGS: Limited cervical spine imaging: Stable to the cervical spine CT today, with retrolisthesis again noted C6 on C7. Thoracic spine segmentation:  Appears to be normal. Alignment: Exaggerated upper thoracic kyphosis (series 24, image 6). No thoracic spondylolisthesis. Vertebrae: Normal T1 and stir signal throughout the T1 vertebral body (series 24, image 6). No evidence of lytic or other focal vertebral lesion there. Likewise, marrow signal throughout the remaining  thoracic vertebrae is within normal limits for age. There is faint degenerative posterior endplate marrow edema and enhancement at T11-T12 superimposed on advanced chronic degenerative endplate changes there. No suspicious marrow lesion. Cord: Axial images degraded by motion despite repeated imaging attempts. Thoracic spinal cord appears within normal limits for age. No cord expansion. No abnormal intradural enhancement. No dural thickening. Conus medullaris appears to be normal at T12-L1. Paraspinal and other soft tissues: Negative visible chest and abdominal viscera aside from cardiomegaly. Thoracic lumbar paraspinal soft tissues are within normal limits. Disc levels: Age-appropriate thoracic spine degeneration. No significant thoracic spinal stenosis, despite disc and endplate degeneration with the levels T2-T3, T8-T9 and, and T11-T12 among the worst affected. IMPRESSION: 1. Negative for T1 vertebral lesion, artifact suspected on the earlier CT. No lytic lesion or suspicious marrow signal in the thoracic spine. 2. Age-appropriate thoracic spine degeneration. No thoracic spinal stenosis. Thoracic spinal cord within normal limits. Electronically Signed  By: Odessa Fleming M.D.   On: 09/22/2020 07:00     EKG: I have personally reviewed.  Sinus rhythm with PAC, QTc 531, anteroseptal infarction pattern, T wave inversion in inferior leads and V5-V6.  Assessment/Plan Principal Problem:   Fall Active Problems:   Diabetes mellitus without complication (HCC)   Hyperlipidemia   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   Acute metabolic encephalopathy   AKI (acute kidney injury) (HCC)   Syncope   Essential hypertension   Fall and syncope: Etiology is not clear. The differential diagnosis is broad, including vasovagal syncope, TIA, arrhythmia given abnormal EKG, orthostatic status, carotid artery stenosis. No seizure activity.  - Place on tele bed for obs - Orthostatic vital signs  - MRI-brain - 2d echo - Neuro checks   - IVF: NS 75 cc/h - PT/OT eval and treat -consulted Dr. Mariah Milling of card  Diabetes mellitus without complication [facility]): No A1c on record. Blood sugar 162. Pt is taking Metformin at home -SSI  Hyperlipidemia -lipitor  HTN: -diltiazem -hold lisinopril -IV hydralazine as needed  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 and acute metabolic encephalopathy: per his son, patient's mental status has been declining since November 2020.  Patient may have undiagnosed early stage of dementia.  -will check Vb12 and TSH -Frequent neuro check -Follow-up MRI for brain  AKI: Likely due to dehydration and continuation of ACEI - IVF: 500 ml of NS, then 75 cc/h - Follow up renal function by BMP - Avoid using renal toxic medications, hypotension and contrast dye (or carefully use) -Hold lisinopril    DVT ppx: SQ Lovenox Code Status: Full code per his son Family Communication:   Yes, patient's son by phone Disposition Plan:  Anticipate discharge back to previous environment Consults called:  Dr. Mariah Milling of card Admission status: Med-surg bed for obs   Status is: Observation  The patient remains OBS appropriate and will d/c before 2 midnights.  Dispo: The patient is from: Home              Anticipated d/c is to: Home              Anticipated d/c date is: 1 day              Patient currently is not medically stable to d/c.           Date of Service 09/22/2020    Lorretta Harp [facility]   If 7PM-7AM, please contact night-coverage [email or web address] 09/22/2020, 11:07 AM

## 2020-09-22 NOTE — Progress Notes (Signed)
*  PRELIMINARY RESULTS* Echocardiogram 2D Echocardiogram has been performed.  Joanette Gula Jervon Ream 09/22/2020, 1:55 PM

## 2020-09-23 ENCOUNTER — Observation Stay: Payer: Medicare Other

## 2020-09-23 DIAGNOSIS — R55 Syncope and collapse: Secondary | ICD-10-CM | POA: Diagnosis not present

## 2020-09-23 DIAGNOSIS — G9341 Metabolic encephalopathy: Secondary | ICD-10-CM | POA: Diagnosis not present

## 2020-09-23 DIAGNOSIS — W19XXXA Unspecified fall, initial encounter: Secondary | ICD-10-CM | POA: Diagnosis not present

## 2020-09-23 DIAGNOSIS — N179 Acute kidney failure, unspecified: Secondary | ICD-10-CM

## 2020-09-23 DIAGNOSIS — R404 Transient alteration of awareness: Secondary | ICD-10-CM | POA: Diagnosis not present

## 2020-09-23 DIAGNOSIS — R413 Other amnesia: Secondary | ICD-10-CM | POA: Diagnosis not present

## 2020-09-23 LAB — BASIC METABOLIC PANEL
Anion gap: 10 (ref 5–15)
BUN: 14 mg/dL (ref 8–23)
CO2: 25 mmol/L (ref 22–32)
Calcium: 9.3 mg/dL (ref 8.9–10.3)
Chloride: 105 mmol/L (ref 98–111)
Creatinine, Ser: 0.92 mg/dL (ref 0.61–1.24)
GFR, Estimated: >60 mL/min (ref >60)
Glucose, Bld: 107 mg/dL — ABNORMAL HIGH (ref 70–99)
Potassium: 3.7 mmol/L (ref 3.5–5.1)
Sodium: 140 mmol/L (ref 135–145)

## 2020-09-23 LAB — CBC
HCT: 32.9 % — ABNORMAL LOW (ref 39.0–52.0)
Hemoglobin: 11.4 g/dL — ABNORMAL LOW (ref 13.0–17.0)
MCH: 32.9 pg (ref 26.0–34.0)
MCHC: 34.7 g/dL (ref 30.0–36.0)
MCV: 94.8 fL (ref 80.0–100.0)
Platelets: 272 10*3/uL (ref 150–400)
RBC: 3.47 MIL/uL — ABNORMAL LOW (ref 4.22–5.81)
RDW: 14.6 % (ref 11.5–15.5)
WBC: 5.9 10*3/uL (ref 4.0–10.5)
nRBC: 0 % (ref 0.0–0.2)

## 2020-09-23 LAB — GLUCOSE, CAPILLARY
Glucose-Capillary: 129 mg/dL — ABNORMAL HIGH (ref 70–99)
Glucose-Capillary: 104 mg/dL — ABNORMAL HIGH (ref 70–99)

## 2020-09-23 IMAGING — MR MR HEAD W/O CM
10 series · 48 of 48 positions shown · non-contrast
Comparison: CT head [DATE].

CLINICAL DATA: 89-year-old male with syncope. Head trauma.
Declining mental status since [REDACTED].

EXAM:
MRI HEAD WITHOUT CONTRAST
TECHNIQUE: Multiplanar, multiecho pulse sequences of the brain and surrounding
structures were obtained without intravenous contrast.

[Series 5: ax dwi_tracew · axial · 3.0mm · 1.31mm/px · z∈[-60,+95]mm · 8 of 48 slices shown]
[im 1/48]
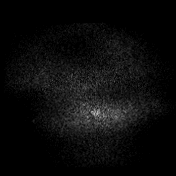
[im 7/48]
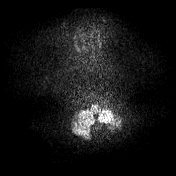
[im 14/48]
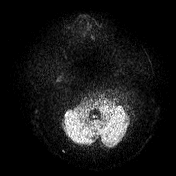
[im 21/48]
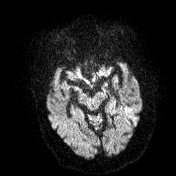
[im 27/48]
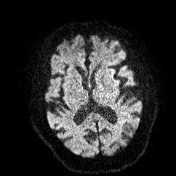
[im 34/48]
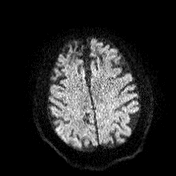
[im 41/48]
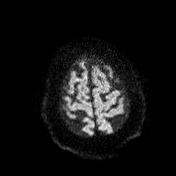
[im 48/48]
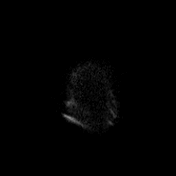

[Series 6: ax dwi_adc · axial · 3.0mm · 1.31mm/px · z∈[-60,+95]mm · 7 of 48 slices shown]
[im 1/48]
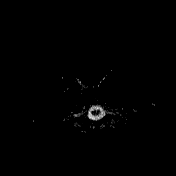
[im 8/48]
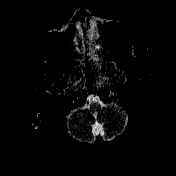
[im 16/48]
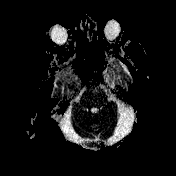
[im 24/48]
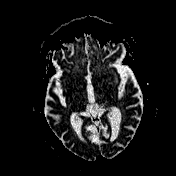
[im 32/48]
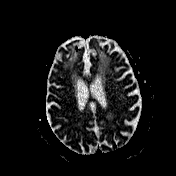
[im 40/48]
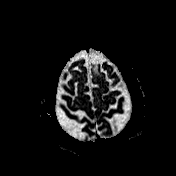
[im 48/48]
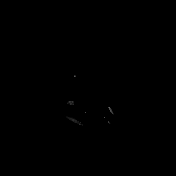

[Series 7: cor dwi_tracew · coronal · 5.0mm · 1.31mm/px · 5 of 38 slices shown]
[im 1/38]
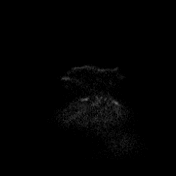
[im 10/38]
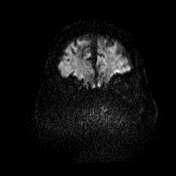
[im 19/38]
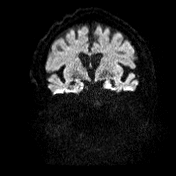
[im 28/38]
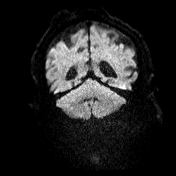
[im 38/38]
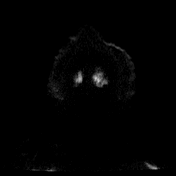

[Series 8: cor dwi_adc · coronal · 5.0mm · 1.31mm/px · 5 of 38 slices shown]
[im 1/38]
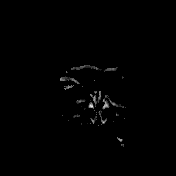
[im 10/38]
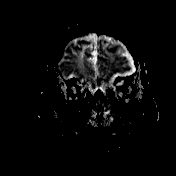
[im 19/38]
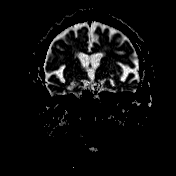
[im 28/38]
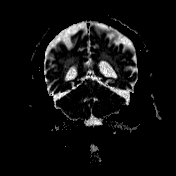
[im 38/38]
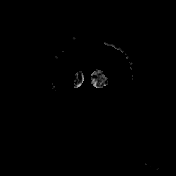

[Series 9: T1 · sagittal · 5.0mm · 0.94mm/px · 3 of 23 slices shown (1 of 2)]
[im 1/23]
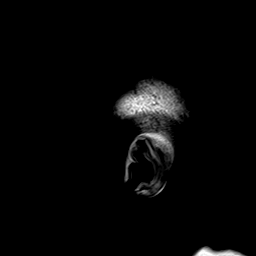
[im 12/23]
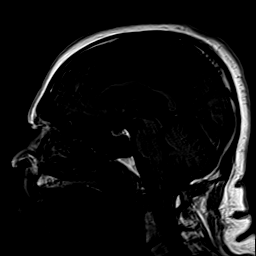
[im 23/23]
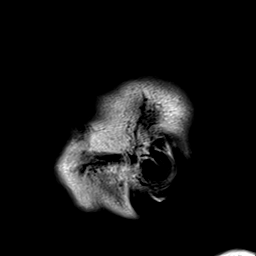

[Series 10: T2 · axial · 5.0mm · 0.45mm/px · z∈[-60,+95]mm · 4 of 27 slices shown (1 of 2)]
[im 1/27]
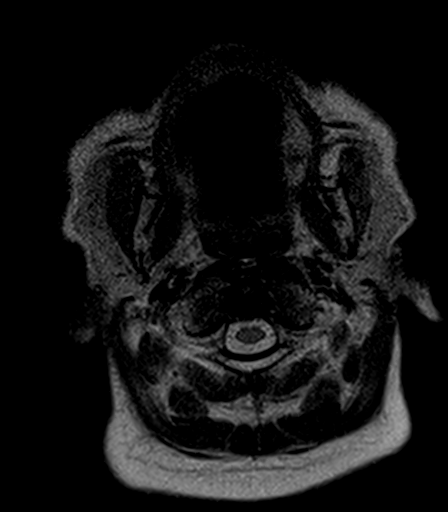
[im 9/27]
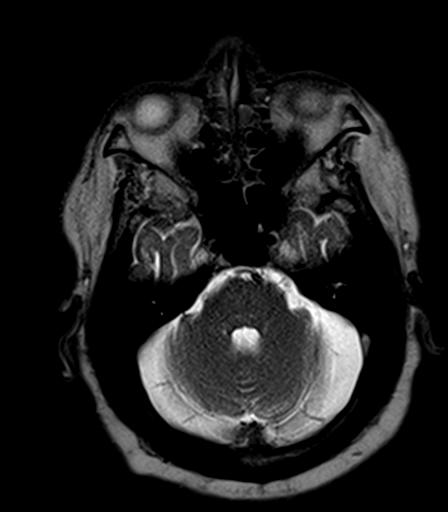
[im 18/27]
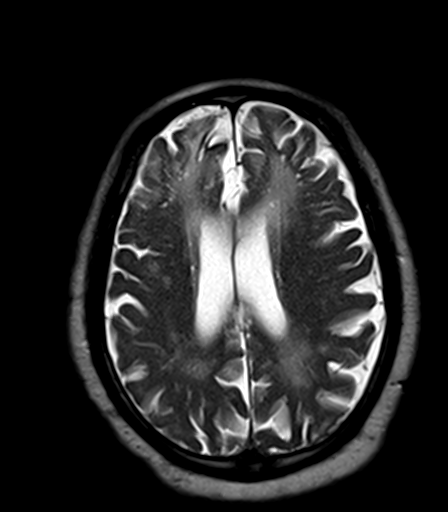
[im 27/27]
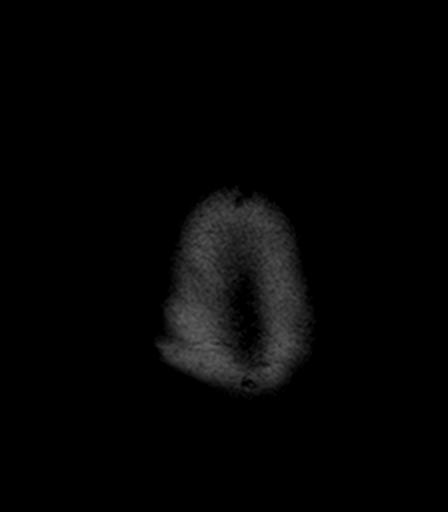

[Series 11: T2-star · axial · 5.0mm · 0.45mm/px · z∈[-60,+95]mm · 4 of 27 slices shown]
[im 1/27]
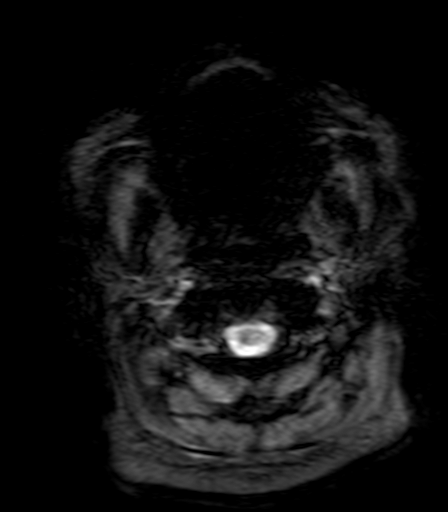
[im 9/27]
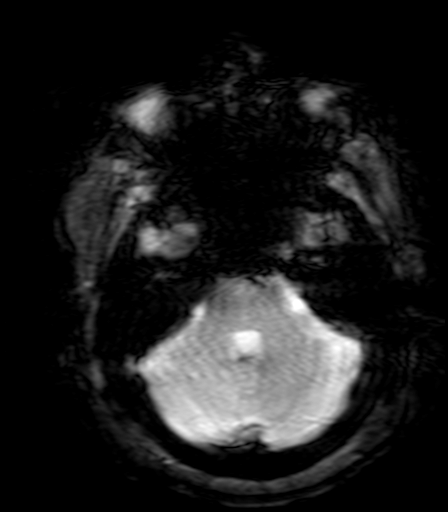
[im 18/27]
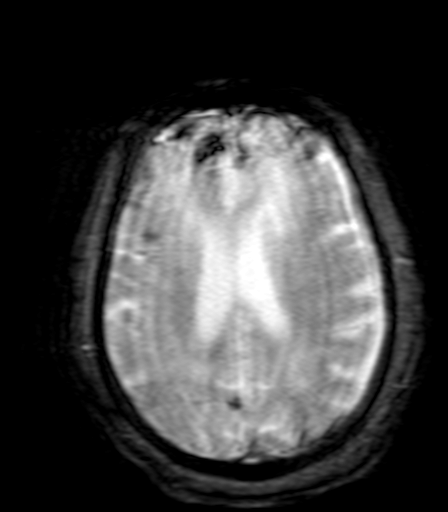
[im 27/27]
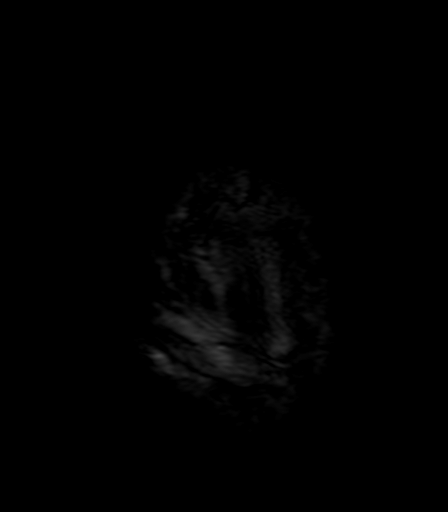

[Series 12: FLAIR · axial · 5.0mm · 1.20mm/px · z∈[-61,+95]mm · 4 of 27 slices shown]
[im 1/27]
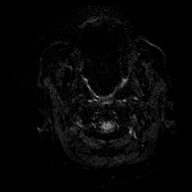
[im 9/27]
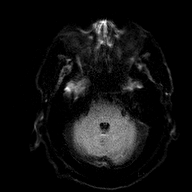
[im 18/27]
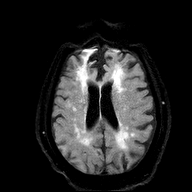
[im 27/27]
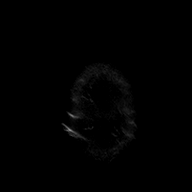

[Series 13: T1 · axial · 5.0mm · 0.90mm/px · z∈[-60,+95]mm · 4 of 27 slices shown (2 of 2)]
[im 1/27]
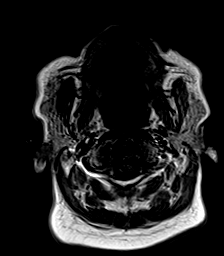
[im 9/27]
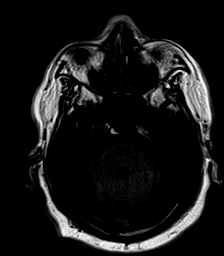
[im 18/27]
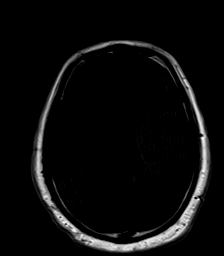
[im 27/27]
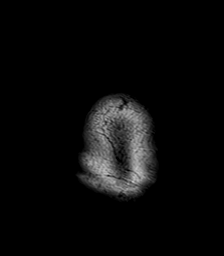

[Series 14: T2 · coronal · 5.0mm · 0.45mm/px · 4 of 31 slices shown (2 of 2)]
[im 1/31]
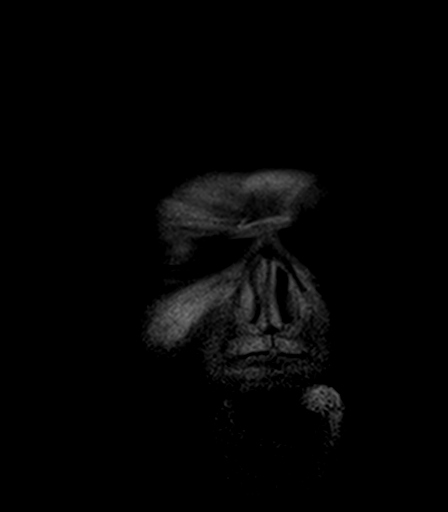
[im 11/31]
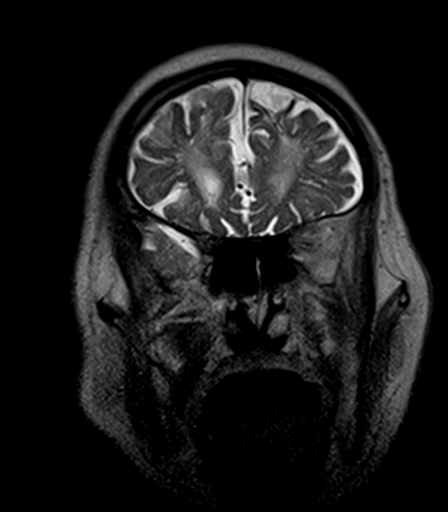
[im 21/31]
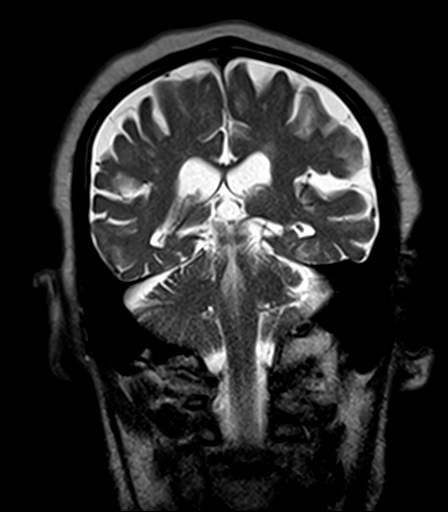
[im 31/31]
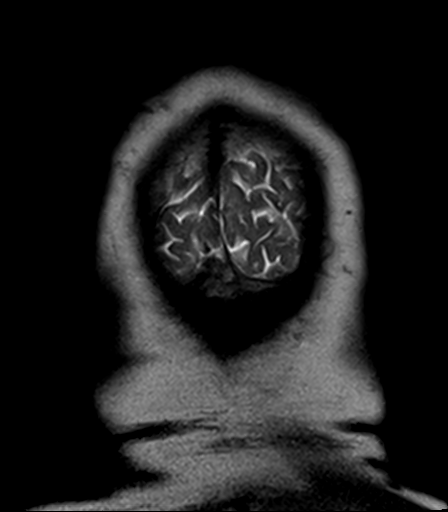

[48 of 48 positions shown; findings below may reference images not displayed]

FINDINGS: Brain: No restricted diffusion to suggest acute infarction. No
midline shift, mass effect, evidence of mass lesion,
ventriculomegaly, extra-axial collection or acute intracranial
hemorrhage. Cervicomedullary junction and pituitary are within
normal limits.

Confluent bilateral cerebral white matter T2 and FLAIR
hyperintensity, associated with right greater than left anterior
frontal lobe cortical encephalomalacia (series 12, image 17).

Superimposed multifocal chronic blood products in the brain,
including confluent hemosiderin in the anterior right frontal lobe
such as from prior hemorrhagic contusion. But also there is
scattered bilateral superficial siderosis (series 11, image 21) and
occasional chronic microhemorrhages (dorsal right thalamus image 15)
and medial left cerebellum image 6).

Comparatively mild T2 heterogeneity in the deep gray matter nuclei,
primarily the basal ganglia. Brainstem appears to remain normal.

Vascular: Major intracranial vascular flow voids are preserved.

Skull and upper cervical spine: Negative for age visible cervical
spine, bone marrow signal.

Sinuses/Orbits: Stable, negative. Postoperative changes to both
globes.

Other: Mastoids remain clear. Grossly normal visible internal
auditory structures.
IMPRESSION: 1. No acute intracranial abnormality.

2. Chronic posttraumatic encephalomalacia in the anterior frontal
lobes, but in addition to likely posttraumatic chronic hemorrhage,
there is superficial siderosis, and occasional chronic micro
hemorrhages.
Amyloid angiopathy would be difficult to exclude.

## 2020-09-23 MED ORDER — METFORMIN HCL 500 MG PO TABS: 500.0000 mg | ORAL_TABLET | Freq: Two times a day (BID) | ORAL | 1 refills | Status: AC

## 2020-09-23 MED ORDER — LORAZEPAM 2 MG/ML IJ SOLN
1.0000 mg | Freq: Once | INTRAMUSCULAR | Status: AC
  Administered 2020-09-23: 08:00:00 1 mg via INTRAMUSCULAR
  Filled 2020-09-23: qty 1

## 2020-09-23 MED ORDER — ATORVASTATIN CALCIUM 40 MG PO TABS: 40.0000 mg | ORAL_TABLET | Freq: Every day | ORAL | 1 refills | Status: DC

## 2020-09-23 NOTE — TOC Transition Note (Addendum)
Transition of Care [facility]) - CM/SW Discharge Note   Patient Details  Name: Samuel Carr MRN: [id number] Date of Birth: 08-11-1930  [facility] CM/SW Contact:  Margarito Liner, LCSW Phone Number: 09/23/2020, 3:08 PM   Clinical Narrative: Patient has orders to discharge home today. [facility] is able to accept patient's referral. Start of care for nursing will be Sunday and PT will be Monday. Son also requesting Child psychotherapist per [employer]. Asked MD to add to home health order. New patient appt set up with Dr. Ellsworth Lennox at [facility] on Monday 1/3 at 2:30. Information added to AVS. Physician advisor, Dr. Judithann Sheen will cover his home health orders until his appointment. PACE and Advanced Directive information put in discharge packet per son's request. CSW made son aware that we cannot do HCPOA paperwork here since patient is confused. No further concerns. CSW signing off.    Final next level of care: Home w Home Health Services Barriers to Discharge: No Barriers Identified   Patient Goals and CMS Choice     Choice offered to / list presented to : Adult Children  Discharge Placement                Patient to be transferred to facility by: Son will take him home Name of family member notified: Sanjuan Sawa Patient and family notified of of transfer: 09/23/20  Discharge Plan and Services     Post Acute Care Choice: Home Health,Durable Medical Equipment          DME Arranged: Dan Humphreys rolling DME Agency: [employer] Date DME Agency Contacted: 09/23/20   Representative spoke with at DME Agency: Santina Samuel [facility] Arranged: RN,PT,OT HH Agency: [facility] Date [facility] Agency Contacted: 09/23/20   Representative spoke with at [facility] Agency: Adriane  Social Determinants of Health (SDOH) Interventions     Readmission Risk Interventions No flowsheet data found.

## 2020-09-23 NOTE — Plan of Care (Signed)
Discharge teaching completed with patient's son who is in stable condition.

## 2020-09-23 NOTE — Clinical Social Work Note (Signed)
Patient only oriented to self. No supports at bedside. CSW left voicemail for son. Will discuss home health recommendations and MOON letter when he calls back.  Charlynn Court, CSW [phone]

## 2020-09-23 NOTE — Progress Notes (Addendum)
Progress Note  Patient Name: Samuel Carr Date of Encounter: 09/23/2020  Primary Cardiologist: New, Dr. Mariah Milling  Subjective   Unable to obtain ROS.  Hard of hearing. States that he is dizzy but unclear if he understood the question  Inpatient Medications    Scheduled Meds: . atorvastatin  40 mg Oral Daily  . diltiazem  240 mg Oral Daily  . enoxaparin (LOVENOX) injection  40 mg Subcutaneous Q24H  . haloperidol lactate  1 mg Intravenous Once  . influenza vaccine adjuvanted  0.5 mL Intramuscular Tomorrow-1000  . insulin aspart  0-5 Units Subcutaneous QHS  . insulin aspart  0-9 Units Subcutaneous TID WC  . loratadine  10 mg Oral Daily   Continuous Infusions:  PRN Meds: acetaminophen, hydrALAZINE   Vital Signs    Vitals:   09/22/20 1700 09/22/20 1703 09/22/20 2016 09/23/20 0430  BP:  (!) 161/82 (!) 173/84 (!) 148/66  Pulse: 72 79 85 82  Resp: 16  20 18   Temp: 98 F (36.7 C)  99 F (37.2 C) 98.2 F (36.8 C)  TempSrc: Oral     SpO2: 100% 100% 100% 100%  Height:        Intake/Output Summary (Last 24 hours) at 09/23/2020 1006 Last data filed at 09/22/2020 2133 Gross per 24 hour  Intake --  Output 200 ml  Net -200 ml   No flowsheet data found.    Telemetry    Not on telemetry- Personally Reviewed  ECG    No new trace tracings- Personally Reviewed  Physical Exam   GEN:  Frail and elderly male. No acute distress. Sleeping. Neck: No JVD Cardiac: RRR, no murmurs, rubs, or gallops.  Respiratory:  Poor inspiratory effort. Clear to auscultation bilaterally. GI: Soft, nontender, non-distended  MS: No edema; No deformity. Neuro:   Not well oriented to self, place, others. Psych: Normal affect   Labs    High Sensitivity Troponin:   Recent Labs  Lab 09/22/20 0021 09/22/20 0250  TROPONINIHS 5 7      Cardiac EnzymesNo results for input(s): TROPONINI in the last 168 hours. No results for input(s): TROPIPOC in the last 168 hours.    Chemistry Recent Labs  Lab 09/22/20 0250 09/23/20 0403  NA 136 140  K 4.3 3.7  CL 103 105  CO2 20* 25  GLUCOSE 162* 107*  BUN 22 14  CREATININE 1.43* 0.92  CALCIUM 9.0 9.3  PROT 7.8  --   ALBUMIN 3.6  --   AST 23  --   ALT 13  --   ALKPHOS 81  --   BILITOT 0.6  --   GFRNONAA 47* >60  ANIONGAP 13 10     Hematology Recent Labs  Lab 09/21/20 2228 09/23/20 0403  WBC 10.2 5.9  RBC 3.65* 3.47*  HGB 12.1* 11.4*  HCT 35.9* 32.9*  MCV 98.4 94.8  MCH 33.2 32.9  MCHC 33.7 34.7  RDW 14.6 14.6  PLT 276 272    BNP Recent Labs  Lab 09/21/20 2228  BNP 24.3     DDimer No results for input(s): DDIMER in the last 168 hours.   Radiology    CT Head Wo Contrast  Result Date: 09/21/2020 CLINICAL DATA:  Head trauma unconscious EXAM: CT HEAD WITHOUT CONTRAST CT CERVICAL SPINE WITHOUT CONTRAST TECHNIQUE: Multidetector CT imaging of the head and cervical spine was performed following the standard protocol without intravenous contrast. Multiplanar CT image reconstructions of the cervical spine were also generated. COMPARISON:  None. FINDINGS: CT  HEAD FINDINGS Brain: No acute territorial infarction, hemorrhage, or intracranial mass. Moderate atrophy. Encephalomalacia within the bilateral frontal lobes. Moderate hypodensity in the white matter consistent with chronic small vessel ischemic change. Nondilated ventricles. Vascular: No hyperdense vessels.  Carotid vascular calcification Skull: Normal. Negative for fracture or focal lesion. Sinuses/Orbits: No acute finding. Other: None CT CERVICAL SPINE FINDINGS Alignment: Generalized straightening of the cervical spine. Trace retrolisthesis C6 on C7. Skull base and vertebrae: Vertebral body heights are maintained. There is no fracture identified. Poorly visible superior endplate at T1 with possible lytic lesion in the upper portion of the vertebral body. Soft tissues and spinal canal: No prevertebral fluid or swelling. No visible canal  hematoma. Disc levels: Multiple level degenerative change with advanced disease at C6-C7 and C7-T1. Upper chest: Negative. Other: None IMPRESSION: 1. No CT evidence for acute intracranial abnormality. Atrophy and chronic small vessel ischemic changes of the white matter. Encephalomalacia of the bilateral frontal lobes. 2. Poorly visible superior endplate at T1 with possible lytic lesion in the upper portion of the vertebral body. Consider further evaluation with MRI. 3. Straightening of the cervical spine with degenerative changes. No acute osseous abnormality. Electronically Signed   By: Jasmine Pang M.D.   On: 09/21/2020 23:17   CT Cervical Spine Wo Contrast  Result Date: 09/21/2020 CLINICAL DATA:  Head trauma unconscious EXAM: CT HEAD WITHOUT CONTRAST CT CERVICAL SPINE WITHOUT CONTRAST TECHNIQUE: Multidetector CT imaging of the head and cervical spine was performed following the standard protocol without intravenous contrast. Multiplanar CT image reconstructions of the cervical spine were also generated. COMPARISON:  None. FINDINGS: CT HEAD FINDINGS Brain: No acute territorial infarction, hemorrhage, or intracranial mass. Moderate atrophy. Encephalomalacia within the bilateral frontal lobes. Moderate hypodensity in the white matter consistent with chronic small vessel ischemic change. Nondilated ventricles. Vascular: No hyperdense vessels.  Carotid vascular calcification Skull: Normal. Negative for fracture or focal lesion. Sinuses/Orbits: No acute finding. Other: None CT CERVICAL SPINE FINDINGS Alignment: Generalized straightening of the cervical spine. Trace retrolisthesis C6 on C7. Skull base and vertebrae: Vertebral body heights are maintained. There is no fracture identified. Poorly visible superior endplate at T1 with possible lytic lesion in the upper portion of the vertebral body. Soft tissues and spinal canal: No prevertebral fluid or swelling. No visible canal hematoma. Disc levels: Multiple level  degenerative change with advanced disease at C6-C7 and C7-T1. Upper chest: Negative. Other: None IMPRESSION: 1. No CT evidence for acute intracranial abnormality. Atrophy and chronic small vessel ischemic changes of the white matter. Encephalomalacia of the bilateral frontal lobes. 2. Poorly visible superior endplate at T1 with possible lytic lesion in the upper portion of the vertebral body. Consider further evaluation with MRI. 3. Straightening of the cervical spine with degenerative changes. No acute osseous abnormality. Electronically Signed   By: Jasmine Pang M.D.   On: 09/21/2020 23:17   MR BRAIN WO CONTRAST  Result Date: 09/23/2020 CLINICAL DATA:  86 year old male with syncope. Head trauma. Declining mental status since November. EXAM: MRI HEAD WITHOUT CONTRAST TECHNIQUE: Multiplanar, multiecho pulse sequences of the brain and surrounding structures were obtained without intravenous contrast. COMPARISON:  CT head 09/21/2020. FINDINGS: Brain: No restricted diffusion to suggest acute infarction. No midline shift, mass effect, evidence of mass lesion, ventriculomegaly, extra-axial collection or acute intracranial hemorrhage. Cervicomedullary junction and pituitary are within normal limits. Confluent bilateral cerebral white matter T2 and FLAIR hyperintensity, associated with right greater than left anterior frontal lobe cortical encephalomalacia (series 12, image 17). Superimposed multifocal chronic  blood products in the brain, including confluent hemosiderin in the anterior right frontal lobe such as from prior hemorrhagic contusion. But also there is scattered bilateral superficial siderosis (series 11, image 21) and occasional chronic microhemorrhages (dorsal right thalamus image 15) and medial left cerebellum image 6). Comparatively mild T2 heterogeneity in the deep gray matter nuclei, primarily the basal ganglia. Brainstem appears to remain normal. Vascular: Major intracranial vascular flow voids are  preserved. Skull and upper cervical spine: Negative for age visible cervical spine, bone marrow signal. Sinuses/Orbits: Stable, negative. Postoperative changes to both globes. Other: Mastoids remain clear. Grossly normal visible internal auditory structures. IMPRESSION: 1. No acute intracranial abnormality. 2. 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 Amyloid angiopathy would be difficult to exclude. Electronically Signed   By: Odessa FlemingH  Hall M.D.   On: 09/23/2020 08:59   MR THORACIC SPINE W WO CONTRAST  Result Date: 09/22/2020 CLINICAL DATA:  86 year old male status post unwitnessed fall at home. Questionable T1 superior endplate lytic lesion on cervical spine CT last night. EXAM: MRI THORACIC WITHOUT AND WITH CONTRAST TECHNIQUE: Multiplanar and multiecho pulse sequences of the thoracic spine were obtained without and with intravenous contrast. CONTRAST:  8mL GADAVIST GADOBUTROL 1 MMOL/ML IV SOLN COMPARISON:  Cervical spine CT and head CT 09/21/2020. FINDINGS: Limited cervical spine imaging: Stable to the cervical spine CT today, with retrolisthesis again noted C6 on C7. Thoracic spine segmentation:  Appears to be normal. Alignment: Exaggerated upper thoracic kyphosis (series 24, image 6). No thoracic spondylolisthesis. Vertebrae: Normal T1 and stir signal throughout the T1 vertebral body (series 24, image 6). No evidence of lytic or other focal vertebral lesion there. Likewise, marrow signal throughout the remaining thoracic vertebrae is within normal limits for age. There is faint degenerative posterior endplate marrow edema and enhancement at T11-T12 superimposed on advanced chronic degenerative endplate changes there. No suspicious marrow lesion. Cord: Axial images degraded by motion despite repeated imaging attempts. Thoracic spinal cord appears within  normal limits for age. No cord expansion. No abnormal intradural enhancement. No dural thickening. Conus medullaris appears to be normal at T12-L1. Paraspinal and other soft tissues: Negative visible chest and abdominal viscera aside from cardiomegaly. Thoracic lumbar paraspinal soft tissues are within normal limits. Disc levels: Age-appropriate thoracic spine degeneration. No significant thoracic spinal stenosis, despite disc and endplate degeneration with the levels T2-T3, T8-T9 and, and T11-T12 among the worst affected. IMPRESSION: 1. Negative for T1 vertebral lesion, artifact suspected on the earlier CT. No lytic lesion or suspicious marrow signal in the thoracic spine. 2. Age-appropriate thoracic spine degeneration. No thoracic spinal stenosis. Thoracic spinal cord within normal limits. Electronically Signed   By: Odessa FlemingH  Hall M.D.   On: 09/22/2020 07:00   ECHOCARDIOGRAM COMPLETE  Result Date: 09/22/2020    ECHOCARDIOGRAM REPORT   Patient Name:   Valetta CloseLPHONSIA Kuhl Date of Exam: 09/22/2020 Medical Rec #:  [id number]         Height:       70.0 in Accession #:    [id number]        Weight:       173.9 lb Date of Birth:  06/02/1930          BSA:          1.967 m Patient Age:    89 years          BP:  144/60 mmHg Patient Gender: M                 HR:           67 bpm. Exam Location:  [facility] Procedure: 2D Echo, Color Doppler and Cardiac Doppler Indications:     R55 Syncope  History:         Patient has no prior history of Echocardiogram examinations.                  Risk Factors:Dyslipidemia and Diabetes.  Sonographer:     Humphrey Rolls RDCS (AE) Referring Phys:  [id number] Antonieta Iba Diagnosing Phys: Julien Nordmann MD  Sonographer Comments: Suboptimal parasternal window and no subcostal window. IMPRESSIONS  1. Left ventricular ejection fraction, by estimation, is 60 to 65%. The left ventricle has normal function. The left ventricle has no regional wall motion abnormalities. Left ventricular diastolic parameters  are consistent with Grade I diastolic dysfunction (impaired relaxation).  2. Right ventricular systolic function is normal. The right ventricular size is normal.  3. There is borderline dilatation of the aortic root, measuring 38 mm. FINDINGS  Left Ventricle: Left ventricular ejection fraction, by estimation, is 60 to 65%. The left ventricle has normal function. The left ventricle has no regional wall motion abnormalities. The left ventricular internal cavity size was normal in size. There is  no left ventricular hypertrophy. Left ventricular diastolic parameters are consistent with Grade I diastolic dysfunction (impaired relaxation). Right Ventricle: The right ventricular size is normal. No increase in right ventricular wall thickness. Right ventricular systolic function is normal. Left Atrium: Left atrial size was normal in size. Right Atrium: Right atrial size was normal in size. Pericardium: There is no evidence of pericardial effusion. Mitral Valve: The mitral valve is normal in structure. There is moderate calcification of the mitral valve leaflet(s). Mild mitral annular calcification. No evidence of mitral valve regurgitation. No evidence of mitral valve stenosis. MV peak gradient, 8.8 mmHg. The mean mitral valve gradient is 3.0 mmHg. Tricuspid Valve: The tricuspid valve is normal in structure. Tricuspid valve regurgitation is not demonstrated. No evidence of tricuspid stenosis. Aortic Valve: The aortic valve was not well visualized. Aortic valve regurgitation is not visualized. No aortic stenosis is present. Aortic valve mean gradient measures 6.0 mmHg. Aortic valve peak gradient measures 11.2 mmHg. Aortic valve area, by VTI measures 3.81 cm. Pulmonic Valve: The pulmonic valve was normal in structure. Pulmonic valve regurgitation is not visualized. No evidence of pulmonic stenosis. Aorta: The aortic root is normal in size and structure. There is borderline dilatation of the aortic root, measuring 38 mm.  Venous: The inferior vena cava is normal in size with greater than 50% respiratory variability, suggesting right atrial pressure of 3 mmHg. IAS/Shunts: No atrial level shunt detected by color flow Doppler.  LEFT VENTRICLE PLAX 2D LVIDd:         4.32 cm  Diastology LVIDs:         3.17 cm  LV e' medial:    7.40 cm/s LV PW:         1.00 cm  LV E/e' medial:  11.9 LV IVS:        0.79 cm  LV e' lateral:   5.22 cm/s LVOT diam:     2.20 cm  LV E/e' lateral: 16.8 LV SV:         106 LV SV Index:   54 LVOT Area:     3.80 cm  LEFT ATRIUM  Index LA diam:        2.60 cm 1.32 cm/m LA Vol (A2C):   39.6 ml 20.13 ml/m LA Vol (A4C):   35.4 ml 18.00 ml/m LA Biplane Vol: 38.7 ml 19.68 ml/m  AORTIC VALVE                    PULMONIC VALVE AV Area (Vmax):    3.19 cm     PV Vmax:       1.09 m/s AV Area (Vmean):   3.23 cm     PV Vmean:      67.500 cm/s AV Area (VTI):     3.81 cm     PV VTI:        0.203 m AV Vmax:           167.00 cm/s  PV Peak grad:  4.8 mmHg AV Vmean:          109.000 cm/s PV Mean grad:  2.0 mmHg AV VTI:            0.279 m AV Peak Grad:      11.2 mmHg AV Mean Grad:      6.0 mmHg LVOT Vmax:         140.00 cm/s LVOT Vmean:        92.600 cm/s LVOT VTI:          0.280 m LVOT/AV VTI ratio: 1.00  AORTA Ao Root diam: 3.80 cm MITRAL VALVE MV Area (PHT): 4.31 cm     SHUNTS MV Peak grad:  8.8 mmHg     Systemic VTI:  0.28 m MV Mean grad:  3.0 mmHg     Systemic Diam: 2.20 cm MV Vmax:       1.48 m/s MV Vmean:      81.4 cm/s MV Decel Time: 176 msec MV E velocity: 87.80 cm/s MV A velocity: 144.00 cm/s MV E/A ratio:  0.61 Julien Nordmann MD Electronically signed by Julien Nordmann MD Signature Date/Time: 09/22/2020/3:41:33 PM    Final     Cardiac Studies   Echo TTE 1. Left ventricular ejection fraction, by estimation, is 60 to 65%. The  left ventricle has normal function. The left ventricle has no regional  wall motion abnormalities. Left ventricular diastolic parameters are  consistent with Grade I diastolic   dysfunction (impaired relaxation).  2. Right ventricular systolic function is normal. The right ventricular  size is normal.  3. There is borderline dilatation of the aortic root, measuring 38 mm.  Patient Profile     86 y.o. male with history of near syncope, G1DD, HTN, HLD, borderline dilation of aortic root, and evaluated s/p fall.   Assessment & Plan    Near Syncope --Etiology unclear but thought 2/2 vasovagal etiology given bowel movement and emesis. Unwitnessed. Notes indicate LOC.  --Telemetry and EKG showed LBBB, PACs, unable to exclude secondary AV block type I. No evidence of arrhythmia or atrial fibrillation.  --Echo as above with nl EF, NRWMA, G1DD.  --Check orthostatics. --Zio recommended at discharge.  G1DD --Euvolemic and well compensated. No indication for diuretic at this time.   Hypertension --BP poorly controlled at 10AM with BP 152/75, though just received diltiazem  at 10:12AM. Increase diltiazem as HR allows for optimal BP control and goal BP 130/80 or lower. If needed, additional antihypertensive can be added to regimen.   Borderline dilation of aorta --Periodic monitoring. HR/BP control. Cholesterol control.   Dementia --PT/OT recommended.   DM2 --Per IM.  For questions or updates, please contact [facility] Please consult [email or web address] for contact info under        Signed, Lennon Alstrom, PA-C  09/23/2020, 10:06 AM

## 2020-09-23 NOTE — Care Management Obs Status (Signed)
MEDICARE OBSERVATION STATUS NOTIFICATION   Patient Details  Name: Casson Catena MRN: [id number] Date of Birth: 1930-04-23   Medicare Observation Status Notification Given:  Yes (Will send home with patient.)    Margarito Liner, LCSW 09/23/2020, 12:16 PM

## 2020-09-23 NOTE — Discharge Instructions (Signed)
Pt's son to call [facility] clinic to establish PCP--phone number provided

## 2020-09-23 NOTE — Plan of Care (Signed)
Continuing with plan of care. 

## 2020-09-23 NOTE — Discharge Summary (Signed)
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 Diabetes mellitus without complication (HCC)   . Hyperlipidemia     HOSPITAL COURSE:  Samuel Closelphonsia Pesch is a 86 y.o. male with medical history significant of diabetes mellitus, HTN, hyperlipidemia, hard of hearing, age-related memory loss, who presents with fall-unwitnessed.   Fall and syncope: acute renal failure -- resolved  Etiology for fall is not clear. No seizure activity seen by son at home -pt's telemetry did not show any arrhythmia. - MRI-brain negative for stroke.No acute intracranial abnormality.  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 - 2d echo EF of 60 to 65% with LVH - Neuro checks -- no neural deficit -patient received IVF: NS 75 cc/h-- I discontinued IV fluids. Creatinine normalized. - PT/OT eval ---recommends home health PT OT -consulted Dr. Mariah MillingGollan cardiology-- recommends continue home meds. Patient to go home with Zio monitor-- and will follow-up with Dr. Mariah MillingGollan as outpatient  Diabetes mellitus without complication [facility](HCC): No previous A1c on record. Blood sugar 162. Pt is taking  Metformin at home -SSI -- A1c is 6.1 -- will decrease metformin dose to 500 mg BID sugars are stable.  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  Patient may have undiagnosed early stage of dementia. -- Recommended son to establish primary care in the area for formal evaluation of dementia as outpatient.  AKI: Likely due to dehydration and continuation of ACEI - IVF: 500 ml of NS, then 75 cc/h-- creatinine normalized. - Follow up renal function by BMP - Avoid using renal toxic medications, hypotension and contrast dye (or carefully use) - lisinopril was held at admission however will resume since creatinine has normalized. Patient takes a small dose of lisinopril at home.  Overall hemodynamically stable. No major neural deficit. Will discharge to home. Discharge plan was discussed with patient's son Luther RedoJerry Sager on the phone. He is advised to make appointment with [facility] for PCP. Phone number was provided.    DVT ppx: SQ Lovenox Code Status: Full code per his son Family Communication:   Yes, patient's son by phone Disposition Plan:  Anticipate discharge back to previous environment Consults called:  Dr. Mariah MillingGollan of card Admission status: Med-surg bed for obs   Status is: Observation    Dispo: The patient is from: Home  Anticipated d/c is to: Home with HHPT  Anticipated d/c date is today CONSULTS OBTAINED:  Treatment Team:  Antonieta IbaGollan, Timothy J, MD  DRUG ALLERGIES:  No Known Allergies  DISCHARGE MEDICATIONS:   Allergies as of 09/23/2020   No Known Allergies     Medication List    TAKE these  medications   atorvastatin 40 MG tablet Commonly known as: LIPITOR Take 1 tablet (40 mg total) by mouth daily.   diltiazem 240 MG 24 hr capsule Commonly  known as: TIAZAC Take 240 mg by mouth daily.   lisinopril 2.5 MG tablet Commonly known as: ZESTRIL Take 2.5 mg by mouth daily.   loratadine 10 MG tablet Commonly known as: CLARITIN Take 10 mg by mouth daily.   metFORMIN 500 MG tablet Commonly known as: GLUCOPHAGE Take 1 tablet (500 mg total) by mouth 2 (two) times daily with a meal. What changed:   medication strength  how much to take       If you experience worsening of your admission symptoms, develop shortness of breath, life threatening emergency, suicidal or homicidal thoughts you must seek medical attention immediately by calling 911 or calling your MD immediately  if symptoms less severe.  You Must read complete instructions/literature along with all the possible adverse reactions/side effects for all the Medicines you take and that have been prescribed to you. Take any new Medicines after you have completely understood and accept all the possible adverse reactions/side effects.   Please note  You were cared for by a hospitalist during your hospital stay. If you have any questions about your discharge medications or the care you received while you were in the hospital after you are discharged, you can call the unit and asked to speak with the hospitalist on call if the hospitalist that took care of you is not available. Once you are discharged, your primary care physician will handle any further medical issues. Please note that NO REFILLS for any discharge medications will be authorized once you are discharged, as it is imperative that you return to your primary care physician (or establish a relationship with a primary care physician if you do not have one) for your aftercare needs so that they can reassess your need for medications and monitor your lab values. Today   SUBJECTIVE   No new complaints-- ate good breakfast. No new issues per RN trying to get out of bed. Tells me his name and last name. Unable to hold a  meaningful conversation.  VITAL SIGNS:  Blood pressure (!) 148/81, pulse 77, temperature (!) 97.5 F (36.4 C), temperature source Oral, resp. rate 18, height 5\' 10"  (1.778 m), SpO2 100 %.  I/O:    Intake/Output Summary (Last 24 hours) at 09/23/2020 1204 Last data filed at 09/23/2020 1014 Gross per 24 hour  Intake 240 ml  Output 200 ml  Net 40 ml    PHYSICAL EXAMINATION:  GENERAL:  86 y.o.-year-old patient lying in the bed with no acute distress.  HEENT: Head atraumatic, normocephalic. Oropharynx and nasopharynx clear.  NECK:  Supple, no jugular venous distention. No thyroid enlargement, no tenderness.  LUNGS: Normal breath sounds bilaterally, no wheezing, rales,rhonchi or crepitation. No use of accessory muscles of respiration.  CARDIOVASCULAR: S1, S2 normal. No murmurs, rubs, or gallops.  ABDOMEN: Soft, non-tender, non-distended. Bowel sounds present. No organomegaly or mass.  EXTREMITIES: No pedal edema, cyanosis, or clubbing.  NEUROLOGIC: grossly nonfocal moves all extremities well PSYCHIATRIC: The patient is alert and awake.  SKIN: No obvious rash, lesion, or ulcer.   DATA REVIEW:   CBC  Recent Labs  Lab 09/23/20 0403  WBC 5.9  HGB 11.4*  HCT 32.9*  PLT 272    Chemistries  Recent Labs  Lab 09/22/20 0250 09/23/20 0403  NA 136 140  K 4.3 3.7  CL 103 105  CO2 20* 25  GLUCOSE 162* 107*  BUN 22 14  CREATININE 1.43* 0.92  CALCIUM 9.0 9.3  MG 1.7  --   AST 23  --   ALT 13  --   ALKPHOS 81  --   BILITOT 0.6  --     Microbiology Results   Recent Results (from the past 240 hour(s))  Resp Panel by RT-PCR (Flu A&B, Covid) Nasopharyngeal Swab     Status: None   Collection Time: 09/22/20 12:21 AM   Specimen: Nasopharyngeal Swab; Nasopharyngeal(NP) swabs in vial transport medium  Result Value Ref Range Status   SARS Coronavirus 2 by RT PCR NEGATIVE NEGATIVE Final    Comment: (NOTE) SARS-CoV-2 target nucleic acids are NOT DETECTED.  The SARS-CoV-2 RNA is  generally detectable in upper respiratory specimens during the acute phase of infection. The lowest concentration of SARS-CoV-2 viral copies this assay can detect is 138 copies/mL. A negative result does not preclude SARS-Cov-2 infection and should not be used as the sole basis for treatment or other patient management decisions. A negative result may occur with  improper specimen collection/handling, submission of specimen other than nasopharyngeal swab, presence of viral mutation(s) within the areas targeted by this assay, and inadequate number of viral copies(<138 copies/mL). A negative result must be combined with clinical observations, patient history, and epidemiological information. The expected result is Negative.  Fact Sheet for Patients:  BloggerCourse.com  Fact Sheet for Healthcare Providers:  SeriousBroker.it  This test is no t yet approved or cleared by the [place] FDA and  has been authorized for detection and/or diagnosis of SARS-CoV-2 by FDA under an Emergency Use Authorization (EUA). This EUA will remain  in effect (meaning this test can be used) for the duration of the COVID-19 declaration under Section 564(b)(1) of the Act, 21 U.S.C.section 360bbb-3(b)(1), unless the authorization is terminated  or revoked sooner.       Influenza A by PCR NEGATIVE NEGATIVE Final   Influenza B by PCR NEGATIVE NEGATIVE Final    Comment: (NOTE) The Xpert Xpress SARS-CoV-2/FLU/RSV plus assay is intended as an aid in the diagnosis of influenza from Nasopharyngeal swab specimens and should not be used as a sole basis for treatment. Nasal washings and aspirates are unacceptable for Xpert Xpress SARS-CoV-2/FLU/RSV testing.  Fact Sheet for Patients: BloggerCourse.com  Fact Sheet for Healthcare Providers: SeriousBroker.it  This test is not yet approved or cleared by the [place] FDA and has been authorized for detection and/or diagnosis of SARS-CoV-2 by FDA under an Emergency Use Authorization (EUA). This EUA will remain in effect (meaning this test can be used) for the duration of the COVID-19 declaration under Section 564(b)(1) of the Act, 21 U.S.C. section 360bbb-3(b)(1), unless the authorization is terminated or revoked.  Performed at [facility], [address] [place], [place] [address]     RADIOLOGY:  CT Head Wo Contrast  Result Date: 09/21/2020 CLINICAL DATA:  Head trauma unconscious EXAM: CT HEAD WITHOUT CONTRAST CT CERVICAL SPINE WITHOUT CONTRAST TECHNIQUE: Multidetector CT imaging of the head and cervical spine was performed following the standard protocol without intravenous contrast. Multiplanar CT image reconstructions of the cervical spine were also generated. COMPARISON:  None. FINDINGS: CT HEAD FINDINGS Brain: No acute territorial infarction, hemorrhage, or intracranial mass. Moderate atrophy. Encephalomalacia within the bilateral frontal lobes. Moderate hypodensity in the white matter consistent with chronic small vessel ischemic change. Nondilated ventricles. Vascular: No hyperdense vessels.  Carotid vascular calcification Skull: Normal. Negative for fracture or  focal lesion. Sinuses/Orbits: No acute finding. Other: None CT CERVICAL SPINE FINDINGS Alignment: Generalized straightening of the cervical spine. Trace retrolisthesis C6 on C7. Skull base and vertebrae: Vertebral body heights are maintained. There is no fracture identified. Poorly visible superior endplate at T1 with possible lytic lesion in the upper portion of the vertebral body. Soft tissues and spinal canal: No prevertebral fluid or swelling. No visible canal hematoma. Disc levels: Multiple level degenerative change with advanced disease at C6-C7 and C7-T1. Upper chest: Negative. Other: None IMPRESSION: 1. No CT evidence for acute intracranial abnormality. Atrophy and  chronic small vessel ischemic changes of the white matter. Encephalomalacia of the bilateral frontal lobes. 2. Poorly visible superior endplate at T1 with possible lytic lesion in the upper portion of the vertebral body. Consider further evaluation with MRI. 3. Straightening of the cervical spine with degenerative changes. No acute osseous abnormality. Electronically Signed   By: Jasmine Pang M.D.   On: 09/21/2020 23:17   CT Cervical Spine Wo Contrast  Result Date: 09/21/2020 CLINICAL DATA:  Head trauma unconscious EXAM: CT HEAD WITHOUT CONTRAST CT CERVICAL SPINE WITHOUT CONTRAST TECHNIQUE: Multidetector CT imaging of the head and cervical spine was performed following the standard protocol without intravenous contrast. Multiplanar CT image reconstructions of the cervical spine were also generated. COMPARISON:  None. FINDINGS: CT HEAD FINDINGS Brain: No acute territorial infarction, hemorrhage, or intracranial mass. Moderate atrophy. Encephalomalacia within the bilateral frontal lobes. Moderate hypodensity in the white matter consistent with chronic small vessel ischemic change. Nondilated ventricles. Vascular: No hyperdense vessels.  Carotid vascular calcification Skull: Normal. Negative for fracture or focal lesion. Sinuses/Orbits: No acute finding. Other: None CT CERVICAL SPINE FINDINGS Alignment: Generalized straightening of the cervical spine. Trace retrolisthesis C6 on C7. Skull base and vertebrae: Vertebral body heights are maintained. There is no fracture identified. Poorly visible superior endplate at T1 with possible lytic lesion in the upper portion of the vertebral body. Soft tissues and spinal canal: No prevertebral fluid or swelling. No visible canal hematoma. Disc levels: Multiple level degenerative change with advanced disease at C6-C7 and C7-T1. Upper chest: Negative. Other: None IMPRESSION: 1. No CT evidence for acute intracranial abnormality. Atrophy and chronic small vessel ischemic changes  of the white matter. Encephalomalacia of the bilateral frontal lobes. 2. Poorly visible superior endplate at T1 with possible lytic lesion in the upper portion of the vertebral body. Consider further evaluation with MRI. 3. Straightening of the cervical spine with degenerative changes. No acute osseous abnormality. Electronically Signed   By: Jasmine Pang M.D.   On: 09/21/2020 23:17   MR BRAIN WO CONTRAST  Result Date: 09/23/2020 CLINICAL DATA:  86 year old male with syncope. Head trauma. Declining mental status since November. EXAM: MRI HEAD WITHOUT CONTRAST TECHNIQUE: Multiplanar, multiecho pulse sequences of the brain and surrounding structures were obtained without intravenous contrast. COMPARISON:  CT head 09/21/2020. FINDINGS: Brain: No restricted diffusion to suggest acute infarction. No midline shift, mass effect, evidence of mass lesion, ventriculomegaly, extra-axial collection or acute intracranial hemorrhage. Cervicomedullary junction and pituitary are within normal limits. Confluent bilateral cerebral white matter T2 and FLAIR hyperintensity, associated with right greater than left anterior frontal lobe cortical encephalomalacia (series 12, image 17). Superimposed multifocal chronic blood products in the brain, including confluent hemosiderin in the anterior right frontal lobe such as from prior hemorrhagic contusion. But also there is scattered bilateral superficial siderosis (series 11, image 21) and occasional chronic microhemorrhages (dorsal right thalamus image 15) and medial left cerebellum image 6). Comparatively  mild T2 heterogeneity in the deep gray matter nuclei, primarily the basal ganglia. Brainstem appears to remain normal. Vascular: Major intracranial vascular flow voids are preserved. Skull and upper cervical spine: Negative for age visible cervical spine, bone marrow signal. Sinuses/Orbits: Stable, negative. Postoperative changes to both globes. Other: Mastoids remain clear. Grossly  normal visible internal auditory structures. IMPRESSION: 1. No acute intracranial abnormality. 2. 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 Amyloid angiopathy would be difficult to exclude. Electronically Signed   By: Odessa Fleming M.D.   On: 09/23/2020 08:59   MR THORACIC SPINE W WO CONTRAST  Result Date: 09/22/2020 CLINICAL DATA:  86 year old male status post unwitnessed fall at home. Questionable T1 superior endplate lytic lesion on cervical spine CT last night. EXAM: MRI THORACIC WITHOUT AND WITH CONTRAST TECHNIQUE: Multiplanar and multiecho pulse sequences of the thoracic spine were obtained without and with intravenous contrast. CONTRAST:  78mL GADAVIST GADOBUTROL 1 MMOL/ML IV SOLN COMPARISON:  Cervical spine CT and head CT 09/21/2020. FINDINGS: Limited cervical spine imaging: Stable to the cervical spine CT today, with retrolisthesis again noted C6 on C7. Thoracic spine segmentation:  Appears to be normal. Alignment: Exaggerated upper thoracic kyphosis (series 24, image 6). No thoracic spondylolisthesis. Vertebrae: Normal T1 and stir signal throughout the T1 vertebral body (series 24, image 6). No evidence of lytic or other focal vertebral lesion there. Likewise, marrow signal throughout the remaining thoracic vertebrae is within normal limits for age. There is faint degenerative posterior endplate marrow edema and enhancement at T11-T12 superimposed on advanced chronic degenerative endplate changes there. No suspicious marrow lesion. Cord: Axial images degraded by motion despite repeated imaging attempts. Thoracic spinal cord appears within normal limits for age. No cord expansion. No abnormal intradural enhancement. No dural thickening. Conus medullaris appears to be normal at T12-L1. Paraspinal and other soft tissues: Negative visible chest and  abdominal viscera aside from cardiomegaly. Thoracic lumbar paraspinal soft tissues are within normal limits. Disc levels: Age-appropriate thoracic spine degeneration. No significant thoracic spinal stenosis, despite disc and endplate degeneration with the levels T2-T3, T8-T9 and, and T11-T12 among the worst affected. IMPRESSION: 1. Negative for T1 vertebral lesion, artifact suspected on the earlier CT. No lytic lesion or suspicious marrow signal in the thoracic spine. 2. Age-appropriate thoracic spine degeneration. No thoracic spinal stenosis. Thoracic spinal cord within normal limits. Electronically Signed   By: Odessa Fleming M.D.   On: 09/22/2020 07:00   ECHOCARDIOGRAM COMPLETE  Result Date: 09/22/2020    ECHOCARDIOGRAM REPORT   Patient Name:   AXZEL ROCKHILL Date of Exam: 09/22/2020 Medical Rec #:  [id number]         Height:       70.0 in Accession #:    [id number]        Weight:       173.9 lb Date of Birth:  10-20-1929          BSA:          1.967 m Patient Age:    89 years          BP:           144/60 mmHg Patient Gender: M                 HR:           67 bpm. Exam Location:  [facility] Procedure: 2D Echo, Color Doppler and Cardiac  Doppler Indications:     R55 Syncope  History:         Patient has no prior history of Echocardiogram examinations.                  Risk Factors:Dyslipidemia and Diabetes.  Sonographer:     Humphrey Rolls RDCS (AE) Referring Phys:  [id number] Antonieta Iba Diagnosing Phys: Julien Nordmann MD  Sonographer Comments: Suboptimal parasternal window and no subcostal window. IMPRESSIONS  1. Left ventricular ejection fraction, by estimation, is 60 to 65%. The left ventricle has normal function. The left ventricle has no regional wall motion abnormalities. Left ventricular diastolic parameters are consistent with Grade I diastolic dysfunction (impaired relaxation).  2. Right ventricular systolic function is normal. The right ventricular size is normal.  3. There is borderline dilatation of the aortic  root, measuring 38 mm. FINDINGS  Left Ventricle: Left ventricular ejection fraction, by estimation, is 60 to 65%. The left ventricle has normal function. The left ventricle has no regional wall motion abnormalities. The left ventricular internal cavity size was normal in size. There is  no left ventricular hypertrophy. Left ventricular diastolic parameters are consistent with Grade I diastolic dysfunction (impaired relaxation). Right Ventricle: The right ventricular size is normal. No increase in right ventricular wall thickness. Right ventricular systolic function is normal. Left Atrium: Left atrial size was normal in size. Right Atrium: Right atrial size was normal in size. Pericardium: There is no evidence of pericardial effusion. Mitral Valve: The mitral valve is normal in structure. There is moderate calcification of the mitral valve leaflet(s). Mild mitral annular calcification. No evidence of mitral valve regurgitation. No evidence of mitral valve stenosis. MV peak gradient, 8.8 mmHg. The mean mitral valve gradient is 3.0 mmHg. Tricuspid Valve: The tricuspid valve is normal in structure. Tricuspid valve regurgitation is not demonstrated. No evidence of tricuspid stenosis. Aortic Valve: The aortic valve was not well visualized. Aortic valve regurgitation is not visualized. No aortic stenosis is present. Aortic valve mean gradient measures 6.0 mmHg. Aortic valve peak gradient measures 11.2 mmHg. Aortic valve area, by VTI measures 3.81 cm. Pulmonic Valve: The pulmonic valve was normal in structure. Pulmonic valve regurgitation is not visualized. No evidence of pulmonic stenosis. Aorta: The aortic root is normal in size and structure. There is borderline dilatation of the aortic root, measuring 38 mm. Venous: The inferior vena cava is normal in size with greater than 50% respiratory variability, suggesting right atrial pressure of 3 mmHg. IAS/Shunts: No atrial level shunt detected by color flow Doppler.  LEFT  VENTRICLE PLAX 2D LVIDd:         4.32 cm  Diastology LVIDs:         3.17 cm  LV e' medial:    7.40 cm/s LV PW:         1.00 cm  LV E/e' medial:  11.9 LV IVS:        0.79 cm  LV e' lateral:   5.22 cm/s LVOT diam:     2.20 cm  LV E/e' lateral: 16.8 LV SV:         106 LV SV Index:   54 LVOT Area:     3.80 cm  LEFT ATRIUM             Index LA diam:        2.60 cm 1.32 cm/m LA Vol (A2C):   39.6 ml 20.13 ml/m LA Vol (A4C):   35.4 ml 18.00 ml/m LA Biplane Vol:  38.7 ml 19.68 ml/m  AORTIC VALVE                    PULMONIC VALVE AV Area (Vmax):    3.19 cm     PV Vmax:       1.09 m/s AV Area (Vmean):   3.23 cm     PV Vmean:      67.500 cm/s AV Area (VTI):     3.81 cm     PV VTI:        0.203 m AV Vmax:           167.00 cm/s  PV Peak grad:  4.8 mmHg AV Vmean:          109.000 cm/s PV Mean grad:  2.0 mmHg AV VTI:            0.279 m AV Peak Grad:      11.2 mmHg AV Mean Grad:      6.0 mmHg LVOT Vmax:         140.00 cm/s LVOT Vmean:        92.600 cm/s LVOT VTI:          0.280 m LVOT/AV VTI ratio: 1.00  AORTA Ao Root diam: 3.80 cm MITRAL VALVE MV Area (PHT): 4.31 cm     SHUNTS MV Peak grad:  8.8 mmHg     Systemic VTI:  0.28 m MV Mean grad:  3.0 mmHg     Systemic Diam: 2.20 cm MV Vmax:       1.48 m/s MV Vmean:      81.4 cm/s MV Decel Time: 176 msec MV E velocity: 87.80 cm/s MV A velocity: 144.00 cm/s MV E/A ratio:  0.61 Julien Nordmann MD Electronically signed by Julien Nordmann MD Signature Date/Time: 09/22/2020/3:41:33 PM    Final      CODE STATUS:     Code Status Orders  (From admission, onward)         Start     Ordered   09/22/20 1101  Full code  Continuous        09/22/20 1100        Code Status History    This patient has a current code status but no historical code status.   Advance Care Planning Activity       TOTAL TIME TAKING CARE OF THIS PATIENT: *35* minutes.    Enedina Finner M.D  [facility]    CC: Primary care physician; System, Provider Not In

## 2020-09-23 NOTE — TOC Initial Note (Signed)
Transition of Care [facility]) - Initial/Assessment Note    Patient Details  Name: Samuel Carr MRN: [id number] Date of Birth: May 19, 1930  Transition of Care [facility]) CM/SW Contact:    Margarito Liner, LCSW Phone Number: 09/23/2020, 12:34 PM  Clinical Narrative:   Patient only oriented to self. CSW called patient's son, introduced role, and explained that therapy recommendations would be discussed. Patient's son agreeable to home health. No agency preference. [facility] is reviewing referral. Patient previously lived on the [address] and his PCP was in [place]. Son requesting assistance with finding a local PCP somewhere between [place] and [place]. First preference is [facility] but they do not have appointments until sometime between February and April. Tried [employer] x2 but no answer. Will try again. Patient has a cane and rollator at home but son is requesting a rolling walker. CSW called [employer] representative and ordered.     Expected Discharge Plan: Home w Home Health Services Barriers to Discharge: Other (comment) (No PCP, need to set up home health)   Patient Goals and CMS Choice        Expected Discharge Plan and Services Expected Discharge Plan: Home w Home Health Services     Post Acute Care Choice: Home Health,Durable Medical Equipment Living arrangements for the past 2 months: Single Family Home Expected Discharge Date: 09/23/20               DME Arranged: Dan Humphreys rolling DME Agency: [employer] Date DME Agency Contacted: 09/23/20   Representative spoke with at DME Agency: Santina Evans            Prior Living Arrangements/Services Living arrangements for the past 2 months: Single Family Home Lives with:: Adult Children Patient language and need for interpreter reviewed:: Yes Do you feel safe going back to the place where you live?: Yes      Need for Family Participation in Patient Care: Yes (Comment) Care giver  support system in place?: Yes (comment) Current home services: DME Criminal Activity/Legal Involvement Pertinent to Current Situation/Hospitalization: No - Comment as needed  Activities of Daily Living Home Assistive Devices/Equipment: Cane (specify quad or straight),Other (Comment) (rollator) ADL Screening (condition at time of admission) Patient's cognitive ability adequate to safely complete daily activities?: No Is the patient deaf or have difficulty hearing?: Yes Does the patient have difficulty seeing, even when wearing glasses/contacts?: No Does the patient have difficulty concentrating, remembering, or making decisions?: Yes Patient able to express need for assistance with ADLs?: No Does the patient have difficulty dressing or bathing?: Yes Independently performs ADLs?: No Communication: Independent Dressing (OT): Needs assistance Is this a change from baseline?: Pre-admission baseline Grooming: Needs assistance Is this a change from baseline?: Pre-admission baseline Feeding: Independent Bathing: Needs assistance Is this a change from baseline?: Pre-admission baseline Toileting: Needs assistance Is this a change from baseline?: Pre-admission baseline In/Out Bed: Needs assistance Is this a change from baseline?: Pre-admission baseline Walks in Home: Needs assistance Is this a change from baseline?: Pre-admission baseline Does the patient have difficulty walking or climbing stairs?: No Weakness of Legs: None Weakness of Arms/Hands: None  Permission Sought/Granted Permission sought to share information with : Facility Medical sales representative    Share Information with NAME: Janes Colegrove  Permission granted to share info w AGENCY: Home Health Agencies  Permission granted to share info w Relationship: Son  Permission granted to share info w Contact Information: [phone]  Emotional Assessment Appearance:: Appears stated age Attitude/Demeanor/Rapport: Unable to  Assess Affect (typically observed): Unable to Assess Orientation: : Oriented to Self Alcohol / Substance Use: Not Applicable Psych Involvement: No (comment)  Admission diagnosis:  Transient alteration of awareness [R40.4] Syncope and collapse [R55] Fall [W19.XXXA] Non-intractable vomiting with nausea, unspecified vomiting type [R11.2] Patient Active Problem List   Diagnosis Date Noted  . Fall 09/22/2020  . Essential hypertension 09/22/2020  . Diabetes mellitus without complication (HCC)   . Hyperlipidemia   . 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 Acute metabolic encephalopathy   . AKI (acute kidney injury) (HCC)   . Syncope    PCP:  System, Provider Not In Pharmacy:   [id number] [place], [place] - [address] [place] [address] Phone: [phone] Fax: [phone]  [id number] [place], [place] - [address] [place] [address] Phone: [phone] Fax: [phone]     Social Determinants of Health (SDOH) Interventions    Readmission Risk Interventions No flowsheet data found.

## 2020-09-27 ENCOUNTER — Telehealth: Payer: Self-pay | Admitting: Cardiovascular Disease

## 2020-09-27 NOTE — Telephone Encounter (Signed)
[employer] calling  Wanted to make office aware that patient only wore ZIO monitor for one day He was admitted into hospital and they took it off  Please call patient to discuss further instructions

## 2020-09-28 NOTE — Telephone Encounter (Signed)
[employer] reach out about pt removing monitor, Dr. Mariah Milling consulted 09/22/2020 for pt seen in ED for syncopal episode and A-Fiib, order for pt to be d/c home with Zio monitor, monitor was placed day of d/c. It was removed later on after d/c. Nobie Putnam PA-C seen pt during admission. Pt diagnosis with "Early Cognitive decline with memory loss" by hospitalist and was discharge home with [facility] to follow.   Will route message to Gillian Shields, NP and Nobie Putnam, PA-C for review.

## 2020-09-28 NOTE — Telephone Encounter (Signed)
Noted. Given this patient's dementia and mental status, repeat monitor is not recommended. We can certainly result the data that was collected before the monitor was removed, however, if they mail it back.

## 2020-09-28 NOTE — Telephone Encounter (Signed)
Monitor placed for evaluation of syncope which was likely vasovagal per hospital discharge summary. Reviewed preliminary report on [employer] - shows NSR with known IVCD. Due to dementia and mental status, no repeat monitor at this time.   Please call patient's son and ask them to mail back the monitor if they have not. Tell him the one day monitor showed no acute findings and we will not repeat at this time. Please remind him of upcoming appointment 10/11/19 at 2:30 with Dr. Ellsworth Lennox of [facility]. This is new primary care provider. Their information is below if needed.   Mr. Scibilia does not have outpatient follow up with cardiology scheduled at this time. Okay to schedule with Dr. Mariah Milling if the son wishes to establish with cardiology, but if prefers to solely see primary care due to the patient's multiple co-morbidities that is reasonable.   Dr. Ellsworth Lennox at [facility]  [address] [place], [place] [address]  [phone]  Alver Sorrow, NP

## 2020-09-29 ENCOUNTER — Telehealth: Payer: Self-pay

## 2020-09-29 NOTE — Telephone Encounter (Signed)
Attempted to reach out to pt's Samuel Carr for 2nd time, no answer, LMTCB

## 2020-09-29 NOTE — Telephone Encounter (Signed)
Attempted to reach out to Luther Redo, son of pt, pt known to be demented, Peyton Najjar was point of contact and primary caregiver while pt was admitted. Gaynelle Adu, NP requested to reach out about Zio monitor removed and needed to be shipped back to company. LMTCB if pt and son are wanting to come in for a f/u visit with Dr. Mariah Milling after his recent hospital admission. Phone number was left on VM to reach back out.

## 2020-10-04 ENCOUNTER — Telehealth: Payer: Self-pay

## 2020-10-04 NOTE — Telephone Encounter (Signed)
Attempted to reach pt and his son Teoman Giraud for 3rd time, left another message to call office regarding ZIO monitor that was removed and for possible f/u appt with Dr. Mariah Milling after pt was d/c from hospital admission. Will send a letter in mail with Gillian Shields, NP advice from 09/28/20.  Encounter closed at this time after 3rd attempt.

## 2020-10-06 NOTE — Progress Notes (Signed)
Pt's son Samuel Carr dropped off Zio live tracker today, Zio monitor is not with transmitter, pt lost it when he removed it shortly after being placed on pt (pt was in state of confusion). Called [place] with [employer], she advised to mail in the transmitter.  Pt's son Samuel Carr stated at this time, pt will follow-up with PCP, does not want to make appt with Dr. Mariah Milling or cardiologist. Reports pt has improved in mental status, believes pt took 3 days of daily medications at once as to what caused his fall and confusion that had him admitted into hospital. Reports pt is back to his normal state at this time.   Transmitter placed in outgoing mail box

## 2020-11-10 ENCOUNTER — Ambulatory Visit: Payer: Medicare Other | Admitting: Neurology

## 2020-12-26 ENCOUNTER — Other Ambulatory Visit: Payer: Self-pay

## 2020-12-26 ENCOUNTER — Ambulatory Visit (INDEPENDENT_AMBULATORY_CARE_PROVIDER_SITE_OTHER): Payer: Medicare Other | Admitting: Neurology

## 2020-12-26 ENCOUNTER — Encounter: Payer: Self-pay | Admitting: Neurology

## 2020-12-26 VITALS — BP 162/78 | HR 70 | Ht 72.0 in | Wt 297.8 lb

## 2020-12-26 DIAGNOSIS — F0390 Unspecified dementia without behavioral disturbance: Secondary | ICD-10-CM | POA: Diagnosis not present

## 2020-12-26 DIAGNOSIS — F03B0 Unspecified dementia, moderate, without behavioral disturbance, psychotic disturbance, mood disturbance, and anxiety: Secondary | ICD-10-CM

## 2020-12-26 NOTE — Patient Instructions (Signed)
Good to meet you. Continue 24/7 care. Follow-up in 6-8 months, call for any changes.   FALL PRECAUTIONS: Be cautious when walking. Scan the area for obstacles that may increase the risk of trips and falls. When getting up in the mornings, sit up at the edge of the bed for a few minutes before getting out of bed. Consider elevating the bed at the head end to avoid drop of blood pressure when getting up. Walk always in a well-lit room (use night lights in the walls). Avoid area rugs or power cords from appliances in the middle of the walkways. Use a walker or a cane if necessary and consider physical therapy for balance exercise. Get your eyesight checked regularly.   HOME SAFETY: Consider the safety of the kitchen when operating appliances like stoves, microwave oven, and blender. Consider having supervision and share cooking responsibilities until no longer able to participate in those. Accidents with firearms and other hazards in the house should be identified and addressed as well.   ABILITY TO BE LEFT ALONE: If patient is unable to contact 911 operator, consider using LifeLine, or when the need is there, arrange for someone to stay with patients. Smoking is a fire hazard, consider supervision or cessation. Risk of wandering should be assessed by caregiver and if detected at any point, supervision and safe proof recommendations should be instituted.   RECOMMENDATIONS FOR ALL PATIENTS WITH MEMORY PROBLEMS: 1. Continue to exercise (Recommend 30 minutes of walking everyday, or 3 hours every week) 2. Increase social interactions - continue going to [place] and enjoy social gatherings with friends and family 3. Eat healthy, avoid fried foods and eat more fruits and vegetables 4. Maintain adequate blood pressure, blood sugar, and blood cholesterol level. Reducing the risk of stroke and cardiovascular disease also helps promoting better memory. 5. Avoid stressful situations. Live a simple life and avoid  aggravations. Organize your time and prepare for the next day in anticipation. 6. Sleep well, avoid any interruptions of sleep and avoid any distractions in the bedroom that may interfere with adequate sleep quality 7. Avoid sugar, avoid sweets as there is a strong link between excessive sugar intake, diabetes, and cognitive impairment The Mediterranean diet has been shown to help patients reduce the risk of progressive memory disorders and reduces cardiovascular risk. This includes eating fish, eat fruits and green leafy vegetables, nuts like almonds and hazelnuts, walnuts, and also use olive oil. Avoid fast foods and fried foods as much as possible. Avoid sweets and sugar as sugar use has been linked to worsening of memory function.  There is always a concern of gradual progression of memory problems. If this is the case, then we may need to adjust level of care according to patient needs. Support, both to the patient and caregiver, should then be put into place.

## 2020-12-26 NOTE — Progress Notes (Signed)
NEUROLOGY CONSULTATION NOTE  Samuel Carr MRN: [id number] DOB: 1930/01/07  Referring provider: Dr. Mardella Layman (ER) Primary care provider: Dr. Sherron Monday  Reason for consult:  Memory loss  Dear Dr Tracie Harrier:  Thank you for your kind referral of Samuel Carr for consultation of the above symptoms. Although his history is well known to you, please allow me to reiterate it for the purpose of our medical record. The patient was accompanied to the clinic by his son Samuel Carr who also provides collateral information. Records and images were personally reviewed where available.   HISTORY OF PRESENT ILLNESS: This is an 87 year old right-handed man with a history of hypertension, hyperlipidemia, diabetes, presenting for evaluation of memory loss. He is accompanied by his son Samuel Carr who helps supplement the history today. Samuel Carr reports he had been living alone since his wife passed away in 05/04/19. Samuel Carr started getting phone calls from neighbors that he needed to take the patient's keys, he would drive to the store and have issues. Despite other people driving for him, he would still get the keys. Samuel Carr has been managing finances for both parents for the past 5 years, his wife had been doing finances. He can write his name in cursive but he cannot write a sentence.  Samuel Carr picked him up last November 2021 after the housekeeper reported he was staying in bed most of the day and was not eating. He was brought to the ER for a right axillary wound check. ER also noted altered mental status. He was back in the ER in December 2021 after a syncopal episode. Samuel Carr reports he ate fine that day and went to his room. At that time, they were letting him manage his own medications. His son heard him fall and found him unconscious, face down on the floor with his pants down his ankles. He was lethargic, he seemed to be awake when they turned him over but was not responding. He vomited twice and had bowel  incontinence. He was brought to [facility] where his son reported he has age-related memory loss, recognizing family members, intermittently oriented to time and place, but that day seemed more confused. They checked his pillbox and saw all his pills for the week were taken earlier. I personally reviewed MRI brain without contrast which did not show any acute changes. 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 There was moderate to advanced chronic microvascular disease. He had a Zio monitor which he only wore for 1 day, which showed NSR.  He says he is 87 years old. He denies any headaches, dizziness, diplopia, dysarthria/dysphagia, neck/back pain, bowel/bladder dysfunction, anosmia, or tremors. He sits on his rollator and rolls to the sink where he takes sink baths. Sleep is good. No wandering behavior, paranoia, or hallucinations. Sometimes his thinking goes in a loop about his wife, loss of his wife, thoughts of his life. No family history of seizures. He had 2 sisters with dementia. No significant head injuries or alcohol use.     Laboratory Data: Lab Results  Component Value Date   TSH 3.732 09/22/2020   Lab Results  Component Value Date   VITAMINB12 273 09/22/2020     PAST MEDICAL HISTORY: Past Medical History:  Diagnosis Date  . 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 Diabetes mellitus without complication (HCC)   . Hyperlipidemia     PAST SURGICAL  HISTORY: Past Surgical History:  Procedure Laterality Date  . CYST REMOVAL TRUNK      MEDICATIONS: Current Outpatient Medications on File Prior to Visit  Medication Sig Dispense Refill  . atorvastatin (LIPITOR) 40 MG tablet Take 1 tablet (40 mg total) by mouth daily. 90 tablet 1  . diltiazem (TIAZAC) 240 MG 24 hr capsule Take 240 mg by mouth daily.    [place] Kitchen lisinopril (ZESTRIL)  2.5 MG tablet Take 2.5 mg by mouth daily.    [place] Kitchen loratadine (CLARITIN) 10 MG tablet Take 10 mg by mouth daily.    . metFORMIN (GLUCOPHAGE) 500 MG tablet Take 1 tablet (500 mg total) by mouth 2 (two) times daily with a meal. 60 tablet 1   No current facility-administered medications on file prior to visit.    ALLERGIES: No Known Allergies  FAMILY HISTORY: Family History  Problem Relation Age of Onset  . Alzheimer's disease Sister   . Dementia Sister   . Cerebral aneurysm Sister     SOCIAL HISTORY: Social History   Socioeconomic History  . Marital status: Widowed    Spouse name: Not on file  . Number of children: Not on file  . Years of education: Not on file  . Highest education level: Not on file  Occupational History  . Not on file  Tobacco Use  . Smoking status: Never Smoker  . Smokeless tobacco: Never Used  Substance and Sexual Activity  . Alcohol use: Never  . Drug use: Never  . Sexual activity: Not on file  Other Topics Concern  . Not on file  Social History Narrative  . Not on file   Social Determinants of Health   Financial Resource Strain: Not on file  Food Insecurity: Not on file  Transportation Needs: Not on file  Physical Activity: Not on file  Stress: Not on file  Social Connections: Not on file  Intimate Partner Violence: Not on file     PHYSICAL EXAM: Vitals:   12/26/20 1425  BP: (!) 162/78  Pulse: 70  SpO2: 97%   General: No acute distress Head:  Normocephalic/atraumatic Skin/Extremities: No rash, no edema Neurological Exam: Mental status: alert and oriented to person, states he is 87 years old. No dysarthria or aphasia, Fund of knowledge is reduced. Recent and remote memory are impaired.  Attention and concentration are reduced.  Able to name objects and repeat phrases. MMSE 13/30 MMSE - Mini Mental State Exam 12/26/2020  Orientation to time 0  Orientation to Place 1  Registration 3  Attention/ Calculation 0  Recall 2  Language- name  2 objects 2  Language- repeat 1  Language- follow 3 step command 3  Language- read & follow direction 1  Write a sentence 0  Copy design 0  Total score 13    Cranial nerves: CN I: not tested CN II: pupils equal, round and reactive to light, visual fields intact CN III, IV, VI:  full range of motion, no nystagmus, no ptosis CN V: facial sensation intact CN VII: upper and lower face symmetric CN VIII: hearing intact to conversation CN XI: sternocleidomastoid and trapezius muscles intact CN XII: tongue midline Bulk & Tone: normal, no fasciculations. Motor: 5/5 throughout with no pronator drift. Sensation: intact to light touch, cold Deep Tendon Reflexes: +1 throughout Cerebellar: no incoordination on finger to nose testing Gait: slow and cautious with both knees bent, ambulates with cane Tremor: none  IMPRESSION: This is an 87 year old right-handed man with a history of hypertension,  hyperlipidemia, diabetes, presenting for evaluation of memory loss. His neurological exam is non-focal, MMSE today 13/30. MRI brain without contrast which did not show any acute changes. 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 There was moderate to advanced chronic microvascular disease. 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 He is unable to manage complex tasks, he has close supervision among his 4 children, however will need increasing level of care. Findings discussed with his son. We discussed that at this point, potential risks of dementia medications outweigh potential benefits, continue with physical activity, brain stimulation activities, control of vascular risk factors. Follow-up in 6-8 months, call for any changes.   Thank you for allowing me to participate in the care of this patient. Please do not hesitate to call for any  questions or concerns.   Patrcia Dolly, M.D.  CC: Dr. Tracie Harrier, Dr. Ellsworth Lennox

## 2021-02-08 ENCOUNTER — Ambulatory Visit: Payer: Medicare Other | Admitting: Neurology

## 2021-03-30 ENCOUNTER — Telehealth: Payer: Self-pay | Admitting: Neurology

## 2021-04-12 NOTE — Telephone Encounter (Signed)
Pt's son Dorene Sorrow came in wanting to get a letter from Dr. Karel Jarvis to give to their lawyer. The letter needs to state if the patient can make decisions on who he wants to be his Healthcare POA and POA. If the letter can be done this week then he wants to pick it up. He starts a job in [place] next week, so after that he will need it mailed to [address] [place], [place] [address]

## 2021-04-13 NOTE — Telephone Encounter (Signed)
Can you pls check with son, we can write a letter stating patient's diagnosis and that he cannot do tasks (medications, finances) without assistance. But if they need a letter about his decision-making capacity, it is a different evaluation with Dr. Leanor Rubenstein. Thanks

## 2021-04-13 NOTE — Telephone Encounter (Signed)
Spoke with pt son he would like a letter stating patient's diagnosis and that he cannot do tasks (medications, finances) without assistance. And I gave him the number to geriatrics consulting services I will mail him the letter to the address on file once our letter is ready

## 2021-04-17 ENCOUNTER — Encounter: Payer: Self-pay | Admitting: Neurology

## 2021-04-17 NOTE — Telephone Encounter (Signed)
Done, thanks

## 2021-04-17 NOTE — Telephone Encounter (Signed)
Letter mailed to pt.  

## 2021-05-13 DIAGNOSIS — I629 Nontraumatic intracranial hemorrhage, unspecified: Secondary | ICD-10-CM | POA: Insufficient documentation

## 2021-05-13 DIAGNOSIS — Z8679 Personal history of other diseases of the circulatory system: Secondary | ICD-10-CM

## 2021-05-13 DIAGNOSIS — F0390 Unspecified dementia without behavioral disturbance: Secondary | ICD-10-CM | POA: Diagnosis present

## 2021-05-13 DIAGNOSIS — I639 Cerebral infarction, unspecified: Secondary | ICD-10-CM | POA: Insufficient documentation

## 2021-05-13 HISTORY — DX: Personal history of other diseases of the circulatory system: Z86.79

## 2021-05-13 HISTORY — DX: Cerebral infarction, unspecified: I63.9

## 2021-05-13 HISTORY — DX: Nontraumatic intracranial hemorrhage, unspecified: I62.9

## 2021-05-23 DIAGNOSIS — E119 Type 2 diabetes mellitus without complications: Secondary | ICD-10-CM

## 2021-05-30 ENCOUNTER — Telehealth: Payer: Self-pay | Admitting: Neurology

## 2021-05-30 NOTE — Telephone Encounter (Signed)
Pt son called back to get more information about the pt seizure no answer left a voice mail to call the office back

## 2021-05-30 NOTE — Telephone Encounter (Signed)
Pt's son called in and left a message with the access nurse. He stated the patient had his first seizure on 05/04/21. He asked for an appointment.

## 2021-05-30 NOTE — Telephone Encounter (Signed)
Spoke with pt son, pt was at his daughters house when he had a fall when his knee gave out, they tried to get him up he had a seizure. EMS came out and pt went to the hospital had a second seizure they started him on seizure medication pt was D/c from hospital after 3 weeks to rehab [place] [facility]. Pt transferred to the front to get pt scheduled for a sooner appointment,

## 2021-05-31 NOTE — Telephone Encounter (Signed)
Can you pls check which hospital he went to, nothing on Epic, so we can request for records before his appt on 8/31. Thanks

## 2021-06-01 NOTE — Telephone Encounter (Signed)
Notes reviewed. Patient was admitted 05/14/21 at [facility] with left leg weakness that progressed to jerking/tremor and then to left arm. 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 EEG mild diffuse slowing with no clear epileptiform activity. Patient discharged home on Keppra 500mg  BID. Also had new onset atrial fibrillation with episodes of wide complex tachycardia suspected related to aberrant conduction per Cardiology. Not a candidate for [facility] due to ICH. F/u as scheduled with [facility] next week.

## 2021-06-07 ENCOUNTER — Ambulatory Visit: Payer: Medicare Other | Admitting: Physician Assistant

## 2021-06-14 ENCOUNTER — Other Ambulatory Visit: Payer: Self-pay

## 2021-06-14 ENCOUNTER — Ambulatory Visit (INDEPENDENT_AMBULATORY_CARE_PROVIDER_SITE_OTHER): Payer: Medicare Other | Admitting: Physician Assistant

## 2021-06-14 ENCOUNTER — Encounter: Payer: Self-pay | Admitting: Physician Assistant

## 2021-06-14 VITALS — BP 154/80 | HR 67 | Resp 18 | Ht 72.0 in

## 2021-06-14 DIAGNOSIS — F0390 Unspecified dementia without behavioral disturbance: Secondary | ICD-10-CM | POA: Diagnosis not present

## 2021-06-14 DIAGNOSIS — F03B0 Unspecified dementia, moderate, without behavioral disturbance, psychotic disturbance, mood disturbance, and anxiety: Secondary | ICD-10-CM

## 2021-06-14 NOTE — Progress Notes (Signed)
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  He is unable to manage complex tasks, and currently living in [facility] skilled nursing facility after being hospitalized for syncope and collapse, and found to have possible putting him hemorrhage versus SAH, a as well as possible seizures, currently on Keppra 500 mg twice daily.  Last MMSE was 13/30    Recommendations:  Follow up in 6 to 8 months  Continue 24/7 care Continue Keppra 500 mg twice daily as per PCP   Case discussed with Dr. Karel Jarvis who agrees with the plan    Subjective:   ED visits since last seen:  Hospital admissions: Patient was admitted 05/14/21  Samuel Carr is a 87 y.o. male with a history of hypertension, hyperlipidemia, diabetes, seen today for evaluation of moderate dementia without behavioral disturbance.  The patient is not on any medications for memory.  He was last seen at our office in December 26, 2020.  He was admitted in the interim at [facility] with left leg weakness that progressed to jerking/tremor and then to left arm. CT head showed ICH right medial parietal lobe 1 to 2 cam parenchymal hemorrhage versus SAH, 20mm SAH in right posterior frontal lobe superiorly. EEG mild diffuse slowing with no clear epileptiform activity. Patient discharged home on Keppra 500mg  BID. Also had new onset atrial fibrillation with episodes of wide complex tachycardia suspected related to aberrant conduction per Cardiology. Not a candidate for [facility] due to ICH. Patient was discharged from hospital after 3 weeks to rehab in [place] to [place]. This patient is accompanied in the office by his caregiver who supplements the history.   Previous records as well as any outside records available were reviewed prior to todays visit.    His caregiver reports that his memory is about the same  in the last 2 months, but he has been placed on Seroquel, because initially, he was showing agitation, and was trying to attempt to leave the building several times.  Since then, his mood has been better, and he enjoys the activities provided in the facility, "especially bingo ".  He sleeps well, and there are no apparent episodes of nightmares or sleepwalking, hallucinations or paranoia.  When asked the patient, he only states "no "but will not elaborate.  He is awake however, and appears alert.  He bathes and dresses with assistance.  He ambulates "very little, and when he does use his the walker ".  Most of the day he is in his wheelchair.  He is no longer responsible for finances.  The patient no longer drives.  His appetite is good, no issues with swallowing.  No further falls or head injuries.  No new vision problems, he has history of urine incontinence, and wears diapers.  No issues with constipation or diarrhea.  HISTORY OF PRESENT ILLNESS: This is an 87 year old right-handed man with a history of hypertension, hyperlipidemia, diabetes, presenting for evaluation of memory loss. He is accompanied by his son 92 who helps supplement the history today. Dorene Sorrow reports he had been living alone since his wife passed away in 05-20-2019. August 2020 started getting phone calls from neighbors that he needed to take the patient's keys, he would drive to the store and have issues. Despite other people driving for him, he would still get the keys. Dorene Sorrow has been managing finances for both parents for  the past 5 years, his wife had been doing finances. He can write his name in cursive but he cannot write a sentence.  Dorene Sorrow picked him up last November 2021 after the housekeeper reported he was staying in bed most of the day and was not eating. He was brought to the ER for a right axillary wound check. ER also noted altered mental status. He was back in the ER in December 2021 after a syncopal episode. Dorene Sorrow reports he ate  fine that day and went to his room. At that time, they were letting him manage his own medications. His son heard him fall and found him unconscious, face down on the floor with his pants down his ankles. He was lethargic, he seemed to be awake when they turned him over but was not responding. He vomited twice and had bowel incontinence. He was brought to [facility] where his son reported he has age-related memory loss, recognizing family members, intermittently oriented to time and place, but that day seemed more confused. They checked his pillbox and saw all his pills for the week were taken earlier. I personally reviewed MRI brain without contrast which did not show any acute changes. 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 There was moderate to advanced chronic microvascular disease. He had a Zio monitor which he only wore for 1 day, which showed NSR.   He says he is 87 years old. He denies any headaches, dizziness, diplopia, dysarthria/dysphagia, neck/back pain, bowel/bladder dysfunction, anosmia, or tremors. He sits on his rollator and rolls to the sink where he takes sink baths. Sleep is good. No wandering behavior, paranoia, or hallucinations. Sometimes his thinking goes in a loop about his wife, loss of his wife, thoughts of his life. No family history of seizures. He had 2 sisters with dementia. No significant head injuries or alcohol use.   Recent Labs       Lab Results  Component Value Date    TSH 3.732 09/22/2020      Recent Labs[] Expand by Default       Lab Results  Component Value Date    VITAMINB12 273 09/22/2020        PREVIOUS MEDICATIONS:   CURRENT MEDICATIONS:  Outpatient Encounter Medications as of 06/14/2021  Medication Sig   amLODipine (NORVASC) 5 MG tablet Take by mouth.   atorvastatin (LIPITOR) 40 MG tablet Take 1 tablet (40 mg total) by mouth daily.    atorvastatin (LIPITOR) 80 MG tablet Take by mouth.   diltiazem (TIAZAC) 240 MG 24 hr capsule Take 240 mg by mouth daily.   levETIRAcetam (KEPPRA) 500 MG tablet Take by mouth.   loratadine (CLARITIN) 10 MG tablet Take 10 mg by mouth daily.   metoprolol tartrate (LOPRESSOR) 25 MG tablet Take by mouth.   QUEtiapine (SEROQUEL) 25 MG tablet Take by mouth 2 (two) times daily.   lisinopril (ZESTRIL) 2.5 MG tablet Take 2.5 mg by mouth daily. (Patient not taking: Reported on 06/14/2021)   lisinopril (ZESTRIL) 5 MG tablet Take 5 mg by mouth daily. (Patient not taking: Reported on 06/14/2021)   metFORMIN (GLUCOPHAGE) 500 MG tablet Take 1 tablet (500 mg total) by mouth 2 (two) times daily with a meal. (Patient not taking: Reported on 06/14/2021)   No facility-administered encounter medications on file as of 06/14/2021.     Objective:     PHYSICAL EXAMINATION:    VITALS:   Vitals:   06/14/21 1048  BP: (!) 154/80  Pulse: 67  Resp: 18  SpO2: 98%  Height: 6' (1.829 m)    GEN:  The patient appears stated age and is in NAD. HEENT:  Normocephalic, atraumatic.   Neurological examination:  General: NAD, well-groomed, appears stated age. Orientation: The patient is alert.  Not oriented to person, place or date.  He is 87 years old  cranial nerves: There is good facial symmetry.The speech is not fluent or clear.  No aphasia or dysarthria. Fund of knowledge is reduced. Recent and remote memory are impaired. Attention and concentration are reduced.  Able to name objects (button and pen and key and of parenthesis and unable to repeat phrases.  Hearing is intact to conversational tone.    Sensation: Sensation is intact to light touch throughout Motor: Strength is at least antigravity x4. Tremors: none  DTR's 2/4 in UE/LE       No flowsheet data found. MMSE - Mini Mental State Exam 12/26/2020  Orientation to time 0  Orientation to Place 1  Registration 3  Attention/ Calculation 0  Recall 2  Language-  name 2 objects 2  Language- repeat 1  Language- follow 3 step command 3  Language- read & follow direction 1  Write a sentence 0  Copy design 0  Total score 13    No flowsheet data found.     Movement examination: Tone: There is normal tone in the UE/LE Abnormal movements:  no tremor.  No myoclonus.  No asterixis.   Coordination: Unable to follow commands to perform RAM's. Normal finger to nose  Gait and Station: The patient has some difficulty arising out of a deep-seated chair without the use of the hands. The patient's stride length is short and appears mildly ataxic.  Gait is cautious and narrow, is requiring assistance to move from the wheelchair to ambulate.        Total time spent on today's visit was 30 minutes, including both face-to-face time and nonface-to-face time. Time included that spent on review of records (prior notes available to me/labs/imaging if pertinent), discussing treatment and goals, answering patient's questions and coordinating care.  Cc:  Sherron Monday, MD Marlowe Kays, PA-C

## 2021-06-14 NOTE — Patient Instructions (Addendum)
Good to see you.  Continue 24/7 care.  Continue Keppra 500 mg twice a day  Follow-up in 6-8 months, call for any changes.   FALL PRECAUTIONS: Be cautious when walking. Scan the area for obstacles that may increase the risk of trips and falls. When getting up in the mornings, sit up at the edge of the bed for a few minutes before getting out of bed. Consider elevating the bed at the head end to avoid drop of blood pressure when getting up. Walk always in a well-lit room (use night lights in the walls). Avoid area rugs or power cords from appliances in the middle of the walkways. Use a walker or a cane if necessary and consider physical therapy for balance exercise. Get your eyesight checked regularly.    ABILITY TO BE LEFT ALONE: If patient is unable to contact 911 operator, consider using LifeLine, or when the need is there, arrange for someone to stay with patients. Smoking is a fire hazard, consider supervision or cessation. Risk of wandering should be assessed by caregiver and if detected at any point, supervision and safe proof recommendations should be instituted.   RECOMMENDATIONS FOR ALL PATIENTS WITH MEMORY PROBLEMS: 1. Continue to exercise (Recommend 30 minutes of walking everyday, or 3 hours every week) 2. Increase social interactions - continue going to [place] and enjoy social gatherings with friends and family 3. Eat healthy, avoid fried foods and eat more fruits and vegetables 4. Maintain adequate blood pressure, blood sugar, and blood cholesterol level. Reducing the risk of stroke and cardiovascular disease also helps promoting better memory. 5. Avoid stressful situations. Live a simple life and avoid aggravations. Organize your time and prepare for the next day in anticipation. 6. Sleep well, avoid any interruptions of sleep and avoid any distractions in the bedroom that may interfere with adequate sleep quality 7. Avoid sugar, avoid sweets as there is a strong link between  excessive sugar intake, diabetes, and cognitive impairment The Mediterranean diet has been shown to help patients reduce the risk of progressive memory disorders and reduces cardiovascular risk. This includes eating fish, eat fruits and green leafy vegetables, nuts like almonds and hazelnuts, walnuts, and also use olive oil. Avoid fast foods and fried foods as much as possible. Avoid sweets and sugar as sugar use has been linked to worsening of memory function.  There is always a concern of gradual progression of memory problems. If this is the case, then we may need to adjust level of care according to patient needs. Support, both to the patient and caregiver, should then be put into place.

## 2021-06-15 ENCOUNTER — Telehealth: Payer: Self-pay | Admitting: Physician Assistant

## 2021-06-15 NOTE — Telephone Encounter (Signed)
Pt son called and spoke with him. Voiced understanding of visit yesterday.

## 2021-06-15 NOTE — Telephone Encounter (Signed)
Pt's son called in and left a message with the access nurse. He stated his father had an appointment yesterday and he needs an update on the status of the appointment.

## 2021-06-15 NOTE — Telephone Encounter (Signed)
No answer at 209pm

## 2021-06-16 ENCOUNTER — Ambulatory Visit: Payer: Medicare Other | Admitting: Physician Assistant

## 2021-07-02 DIAGNOSIS — G40909 Epilepsy, unspecified, not intractable, without status epilepticus: Secondary | ICD-10-CM

## 2021-07-02 DIAGNOSIS — M6281 Muscle weakness (generalized): Secondary | ICD-10-CM | POA: Diagnosis present

## 2021-08-02 ENCOUNTER — Ambulatory Visit: Payer: Medicare Other | Admitting: Neurology

## 2021-12-14 ENCOUNTER — Encounter: Payer: Self-pay | Admitting: Physician Assistant

## 2021-12-14 ENCOUNTER — Ambulatory Visit: Payer: Medicare Other | Admitting: Physician Assistant

## 2021-12-14 DIAGNOSIS — Z029 Encounter for administrative examinations, unspecified: Secondary | ICD-10-CM

## 2021-12-14 NOTE — Progress Notes (Unsigned)
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  He is unable to manage complex tasks, and currently living in [facility] skilled nursing facility after being hospitalized for syncope and collapse, and found to have possible putting him hemorrhage versus SAH, a as well as possible seizures, currently on Keppra 500 mg twice daily.  Last MMSE was 13/30    Recommendations:  Follow up in 6 to 8 months  Continue 24/7 care Continue Keppra 500 mg twice daily as per PCP   Case discussed with Dr. Karel Jarvis who agrees with the plan    Subjective:     Samuel Carr is a 88 y.o. male with a history of hypertension, hyperlipidemia, diabetes, seen today for evaluation of moderate dementia without behavioral disturbance.  The patient is not on any medications for memory.  He was last seen at our office in December 26, 2020.  He was admitted in the interim at [facility] with left leg weakness that progressed to jerking/tremor and then to left arm. CT head showed ICH right medial parietal lobe 1 to 2 cam parenchymal hemorrhage versus SAH, 28mm SAH in right posterior frontal lobe superiorly. EEG mild diffuse slowing with no clear epileptiform activity. Patient discharged home on Keppra 500mg  BID. Also had new onset atrial fibrillation with episodes of wide complex tachycardia suspected related to aberrant conduction per Cardiology. Not a candidate for [facility] due to ICH. Patient was discharged from hospital after 3 weeks to rehab in [place] to [place]. This patient is accompanied in the office by his caregiver who supplements the history.   Previous records as well as any outside records available were reviewed prior to todays visit.    His caregiver reports that his memory is about the same in the last 2 months, but he has been placed on Seroquel, because initially,  he was showing agitation, and was trying to attempt to leave the building several times.  Since then, his mood has been better, and he enjoys the activities provided in the facility, "especially bingo ".  He sleeps well, and there are no apparent episodes of nightmares or sleepwalking, hallucinations or paranoia.  When asked the patient, he only states "no "but will not elaborate.  He is awake however, and appears alert.  He bathes and dresses with assistance.  He ambulates "very little, and when he does use his the walker ".  Most of the day he is in his wheelchair.  He is no longer responsible for finances.  The patient no longer drives.  His appetite is good, no issues with swallowing.  No further falls or head injuries.  No new vision problems, he has history of urine incontinence, and wears diapers.  No issues with constipation or diarrhea.  HISTORY OF PRESENT ILLNESS: This is an 88 year old right-handed man with a history of hypertension, hyperlipidemia, diabetes, presenting for evaluation of memory loss. He is accompanied by his son 92 who helps supplement the history today. Dorene Sorrow reports he had been living alone since his wife passed away in 2019-05-04. August 2020 started getting phone calls from neighbors that he needed to take the patient's keys, he would drive to the store and have issues. Despite other people driving for him, he would still get the keys. Dorene Sorrow has been managing finances for both parents for the past 5 years, his wife had been doing finances. He  can write his name in cursive but he cannot write a sentence.  Dorene Sorrow picked him up last November 2021 after the housekeeper reported he was staying in bed most of the day and was not eating. He was brought to the ER for a right axillary wound check. ER also noted altered mental status. He was back in the ER in December 2021 after a syncopal episode. Dorene Sorrow reports he ate fine that day and went to his room. At that time, they were letting him manage  his own medications. His son heard him fall and found him unconscious, face down on the floor with his pants down his ankles. He was lethargic, he seemed to be awake when they turned him over but was not responding. He vomited twice and had bowel incontinence. He was brought to [facility] where his son reported he has age-related memory loss, recognizing family members, intermittently oriented to time and place, but that day seemed more confused. They checked his pillbox and saw all his pills for the week were taken earlier. I personally reviewed MRI brain without contrast which did not show any acute changes. 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 There was moderate to advanced chronic microvascular disease. He had a Zio monitor which he only wore for 1 day, which showed NSR.   He says he is 88 years old. He denies any headaches, dizziness, diplopia, dysarthria/dysphagia, neck/back pain, bowel/bladder dysfunction, anosmia, or tremors. He sits on his rollator and rolls to the sink where he takes sink baths. Sleep is good. No wandering behavior, paranoia, or hallucinations. Sometimes his thinking goes in a loop about his wife, loss of his wife, thoughts of his life. No family history of seizures. He had 2 sisters with dementia. No significant head injuries or alcohol use.   Recent Labs       Lab Results  Component Value Date    TSH 3.732 09/22/2020      Recent Labs[] Expand by Default       Lab Results  Component Value Date    VITAMINB12 273 09/22/2020        PREVIOUS MEDICATIONS:   CURRENT MEDICATIONS:  Outpatient Encounter Medications as of 12/14/2021  Medication Sig   amLODipine (NORVASC) 5 MG tablet Take by mouth.   atorvastatin (LIPITOR) 40 MG tablet Take 1 tablet (40 mg total) by mouth daily.   atorvastatin (LIPITOR) 80 MG tablet Take by mouth.   diltiazem (TIAZAC) 240  MG 24 hr capsule Take 240 mg by mouth daily.   levETIRAcetam (KEPPRA) 500 MG tablet Take by mouth.   lisinopril (ZESTRIL) 2.5 MG tablet Take 2.5 mg by mouth daily. (Patient not taking: Reported on 06/14/2021)   lisinopril (ZESTRIL) 5 MG tablet Take 5 mg by mouth daily. (Patient not taking: Reported on 06/14/2021)   loratadine (CLARITIN) 10 MG tablet Take 10 mg by mouth daily.   metFORMIN (GLUCOPHAGE) 500 MG tablet Take 1 tablet (500 mg total) by mouth 2 (two) times daily with a meal. (Patient not taking: Reported on 06/14/2021)   metoprolol tartrate (LOPRESSOR) 25 MG tablet Take by mouth.   QUEtiapine (SEROQUEL) 25 MG tablet Take by mouth 2 (two) times daily.   No facility-administered encounter medications on file as of 12/14/2021.     Objective:     PHYSICAL EXAMINATION:    VITALS:   There were no vitals filed for this visit.   GEN:  The patient appears stated age  and is in NAD. HEENT:  Normocephalic, atraumatic.   Neurological examination:  General: NAD, well-groomed, appears stated age. Orientation: The patient is alert.  Not oriented to person, place or date.  He is 88 years old  cranial nerves: There is good facial symmetry.The speech is not fluent or clear.  No aphasia or dysarthria. Fund of knowledge is reduced. Recent and remote memory are impaired. Attention and concentration are reduced.  Able to name objects (button and pen and key and of parenthesis and unable to repeat phrases.  Hearing is intact to conversational tone.    Sensation: Sensation is intact to light touch throughout Motor: Strength is at least antigravity x4. Tremors: none  DTR's 2/4 in UE/LE       No flowsheet data found. MMSE - Mini Mental State Exam 12/26/2020  Orientation to time 0  Orientation to Place 1  Registration 3  Attention/ Calculation 0  Recall 2  Language- name 2 objects 2  Language- repeat 1  Language- follow 3 step command 3  Language- read & follow direction 1  Write a sentence 0   Copy design 0  Total score 13    No flowsheet data found.     Movement examination: Tone: There is normal tone in the UE/LE Abnormal movements:  no tremor.  No myoclonus.  No asterixis.   Coordination: Unable to follow commands to perform RAM's. Normal finger to nose  Gait and Station: The patient has some difficulty arising out of a deep-seated chair without the use of the hands. The patient's stride length is short and appears mildly ataxic.  Gait is cautious and narrow, is requiring assistance to move from the wheelchair to ambulate.        Total time spent on today's visit was 30 minutes, including both face-to-face time and nonface-to-face time. Time included that spent on review of records (prior notes available to me/labs/imaging if pertinent), discussing treatment and goals, answering patient's questions and coordinating care.  Cc:  Sherron Mondayejan-Sie, S Ahmed, MD Marlowe KaysSara Alyzae Hawkey, PA-C

## 2022-10-26 ENCOUNTER — Emergency Department (HOSPITAL_COMMUNITY): Payer: Medicare Other

## 2022-10-26 ENCOUNTER — Inpatient Hospital Stay (HOSPITAL_COMMUNITY)
Admission: EM | Admit: 2022-10-26 | Discharge: 2022-11-01 | DRG: 083 | Disposition: A | Discharge status: Skilled Nursing Facility | Payer: Medicare Other | Attending: Family Medicine | Admitting: Family Medicine

## 2022-10-26 DIAGNOSIS — R269 Unspecified abnormalities of gait and mobility: Secondary | ICD-10-CM

## 2022-10-26 DIAGNOSIS — E860 Dehydration: Secondary | ICD-10-CM | POA: Diagnosis present

## 2022-10-26 DIAGNOSIS — I609 Nontraumatic subarachnoid hemorrhage, unspecified: Secondary | ICD-10-CM

## 2022-10-26 DIAGNOSIS — S066XAA Traumatic subarachnoid hemorrhage with loss of consciousness status unknown, initial encounter: Principal | ICD-10-CM | POA: Diagnosis present

## 2022-10-26 DIAGNOSIS — Z6841 Body Mass Index (BMI) 40.0 and over, adult: Secondary | ICD-10-CM

## 2022-10-26 DIAGNOSIS — M6281 Muscle weakness (generalized): Secondary | ICD-10-CM | POA: Diagnosis present

## 2022-10-26 DIAGNOSIS — I480 Paroxysmal atrial fibrillation: Secondary | ICD-10-CM | POA: Diagnosis present

## 2022-10-26 DIAGNOSIS — Z751 Person awaiting admission to adequate facility elsewhere: Secondary | ICD-10-CM

## 2022-10-26 DIAGNOSIS — Y92003 Bedroom of unspecified non-institutional (private) residence as the place of occurrence of the external cause: Secondary | ICD-10-CM

## 2022-10-26 DIAGNOSIS — E785 Hyperlipidemia, unspecified: Secondary | ICD-10-CM | POA: Diagnosis present

## 2022-10-26 DIAGNOSIS — R296 Repeated falls: Secondary | ICD-10-CM | POA: Diagnosis present

## 2022-10-26 DIAGNOSIS — Z79899 Other long term (current) drug therapy: Secondary | ICD-10-CM

## 2022-10-26 DIAGNOSIS — E119 Type 2 diabetes mellitus without complications: Secondary | ICD-10-CM

## 2022-10-26 DIAGNOSIS — F0390 Unspecified dementia without behavioral disturbance: Secondary | ICD-10-CM | POA: Diagnosis present

## 2022-10-26 DIAGNOSIS — N179 Acute kidney failure, unspecified: Secondary | ICD-10-CM | POA: Diagnosis present

## 2022-10-26 DIAGNOSIS — R4182 Altered mental status, unspecified: Secondary | ICD-10-CM | POA: Diagnosis not present

## 2022-10-26 DIAGNOSIS — H9193 Unspecified hearing loss, bilateral: Secondary | ICD-10-CM | POA: Diagnosis present

## 2022-10-26 DIAGNOSIS — W19XXXA Unspecified fall, initial encounter: Secondary | ICD-10-CM | POA: Diagnosis present

## 2022-10-26 DIAGNOSIS — S065XAA Traumatic subdural hemorrhage with loss of consciousness status unknown, initial encounter: Secondary | ICD-10-CM | POA: Diagnosis present

## 2022-10-26 DIAGNOSIS — W1839XA Other fall on same level, initial encounter: Secondary | ICD-10-CM | POA: Diagnosis present

## 2022-10-26 DIAGNOSIS — G40909 Epilepsy, unspecified, not intractable, without status epilepticus: Secondary | ICD-10-CM | POA: Diagnosis present

## 2022-10-26 DIAGNOSIS — Z7984 Long term (current) use of oral hypoglycemic drugs: Secondary | ICD-10-CM

## 2022-10-26 DIAGNOSIS — E1149 Type 2 diabetes mellitus with other diabetic neurological complication: Secondary | ICD-10-CM | POA: Diagnosis present

## 2022-10-26 DIAGNOSIS — I10 Essential (primary) hypertension: Secondary | ICD-10-CM | POA: Diagnosis present

## 2022-10-26 DIAGNOSIS — I629 Nontraumatic intracranial hemorrhage, unspecified: Principal | ICD-10-CM

## 2022-10-26 DIAGNOSIS — Z820 Family history of epilepsy and other diseases of the nervous system: Secondary | ICD-10-CM

## 2022-10-26 DIAGNOSIS — G9389 Other specified disorders of brain: Secondary | ICD-10-CM

## 2022-10-26 DIAGNOSIS — E669 Obesity, unspecified: Secondary | ICD-10-CM | POA: Diagnosis present

## 2022-10-26 HISTORY — DX: Syncope and collapse: R55

## 2022-10-26 HISTORY — DX: Metabolic encephalopathy: G93.41

## 2022-10-26 NOTE — ED Provider Triage Note (Signed)
Emergency Medicine Provider Triage Evaluation Note  Samuel Carr , a 89 y.o. male  was evaluated in triage.  Pt complains of fall today. Seems like his R leg hurt. His daughter is with him. Has dementia. Hx of stroke.     Review of Systems  Positive: fall Negative: Fever   Physical Exam  BP 138/64 (BP Location: Left Arm)   Pulse 61   Temp 97.7 F (36.5 C)   Resp 19   SpO2 98%  Gen:   Awake, no distress   Resp:  Normal effort  MSK:   Moves extremities without difficulty  Other:    Medical Decision Making  Medically screening exam initiated at 11:26 PM.  Appropriate orders placed.  Essam Lowdermilk was informed that the remainder of the evaluation will be completed by another provider, this initial triage assessment does not replace that evaluation, and the importance of remaining in the ED until their evaluation is complete.  Labs, CT head/cspine and Edison Simon, [place] 10/26/22 2333

## 2022-10-26 NOTE — ED Triage Notes (Signed)
Daughter reported she found the patient on the floor at home this evening , patient unable to recall incident , history of dementia , mild left lower leg pain . He uses a walker to ambulate at home , he is not taking anticoagulant .

## 2022-10-27 ENCOUNTER — Inpatient Hospital Stay (HOSPITAL_COMMUNITY): Payer: Medicare Other

## 2022-10-27 ENCOUNTER — Emergency Department (HOSPITAL_COMMUNITY): Payer: Medicare Other

## 2022-10-27 ENCOUNTER — Observation Stay (HOSPITAL_COMMUNITY): Payer: Medicare Other

## 2022-10-27 ENCOUNTER — Encounter (HOSPITAL_COMMUNITY): Payer: Self-pay

## 2022-10-27 DIAGNOSIS — R296 Repeated falls: Secondary | ICD-10-CM | POA: Diagnosis present

## 2022-10-27 DIAGNOSIS — Z7984 Long term (current) use of oral hypoglycemic drugs: Secondary | ICD-10-CM | POA: Diagnosis not present

## 2022-10-27 DIAGNOSIS — G40909 Epilepsy, unspecified, not intractable, without status epilepticus: Secondary | ICD-10-CM | POA: Diagnosis present

## 2022-10-27 DIAGNOSIS — R569 Unspecified convulsions: Secondary | ICD-10-CM

## 2022-10-27 DIAGNOSIS — S06350A Traumatic hemorrhage of left cerebrum without loss of consciousness, initial encounter: Secondary | ICD-10-CM | POA: Diagnosis not present

## 2022-10-27 DIAGNOSIS — R4182 Altered mental status, unspecified: Secondary | ICD-10-CM | POA: Diagnosis present

## 2022-10-27 DIAGNOSIS — E669 Obesity, unspecified: Secondary | ICD-10-CM | POA: Diagnosis present

## 2022-10-27 DIAGNOSIS — F03B0 Unspecified dementia, moderate, without behavioral disturbance, psychotic disturbance, mood disturbance, and anxiety: Secondary | ICD-10-CM | POA: Diagnosis not present

## 2022-10-27 DIAGNOSIS — I10 Essential (primary) hypertension: Secondary | ICD-10-CM | POA: Diagnosis present

## 2022-10-27 DIAGNOSIS — Z751 Person awaiting admission to adequate facility elsewhere: Secondary | ICD-10-CM | POA: Diagnosis not present

## 2022-10-27 DIAGNOSIS — E860 Dehydration: Secondary | ICD-10-CM | POA: Diagnosis present

## 2022-10-27 DIAGNOSIS — S065XAA Traumatic subdural hemorrhage with loss of consciousness status unknown, initial encounter: Secondary | ICD-10-CM | POA: Diagnosis present

## 2022-10-27 DIAGNOSIS — I609 Nontraumatic subarachnoid hemorrhage, unspecified: Secondary | ICD-10-CM | POA: Diagnosis not present

## 2022-10-27 DIAGNOSIS — Z820 Family history of epilepsy and other diseases of the nervous system: Secondary | ICD-10-CM | POA: Diagnosis not present

## 2022-10-27 DIAGNOSIS — W19XXXA Unspecified fall, initial encounter: Secondary | ICD-10-CM | POA: Diagnosis not present

## 2022-10-27 DIAGNOSIS — E1149 Type 2 diabetes mellitus with other diabetic neurological complication: Secondary | ICD-10-CM | POA: Diagnosis present

## 2022-10-27 DIAGNOSIS — Y92003 Bedroom of unspecified non-institutional (private) residence as the place of occurrence of the external cause: Secondary | ICD-10-CM | POA: Diagnosis not present

## 2022-10-27 DIAGNOSIS — N179 Acute kidney failure, unspecified: Secondary | ICD-10-CM | POA: Diagnosis present

## 2022-10-27 DIAGNOSIS — H9193 Unspecified hearing loss, bilateral: Secondary | ICD-10-CM | POA: Diagnosis present

## 2022-10-27 DIAGNOSIS — Z79899 Other long term (current) drug therapy: Secondary | ICD-10-CM | POA: Diagnosis not present

## 2022-10-27 DIAGNOSIS — Z6841 Body Mass Index (BMI) 40.0 and over, adult: Secondary | ICD-10-CM | POA: Diagnosis not present

## 2022-10-27 DIAGNOSIS — F0390 Unspecified dementia without behavioral disturbance: Secondary | ICD-10-CM | POA: Diagnosis present

## 2022-10-27 DIAGNOSIS — E785 Hyperlipidemia, unspecified: Secondary | ICD-10-CM | POA: Diagnosis present

## 2022-10-27 DIAGNOSIS — S066XAA Traumatic subarachnoid hemorrhage with loss of consciousness status unknown, initial encounter: Secondary | ICD-10-CM | POA: Diagnosis present

## 2022-10-27 DIAGNOSIS — I6389 Other cerebral infarction: Secondary | ICD-10-CM | POA: Diagnosis not present

## 2022-10-27 DIAGNOSIS — W1839XA Other fall on same level, initial encounter: Secondary | ICD-10-CM | POA: Diagnosis present

## 2022-10-27 DIAGNOSIS — I629 Nontraumatic intracranial hemorrhage, unspecified: Secondary | ICD-10-CM | POA: Diagnosis not present

## 2022-10-27 DIAGNOSIS — I480 Paroxysmal atrial fibrillation: Secondary | ICD-10-CM

## 2022-10-27 LAB — COMPREHENSIVE METABOLIC PANEL
ALT: 19 U/L (ref 0–44)
AST: 28 U/L (ref 15–41)
Albumin: 3.7 g/dL (ref 3.5–5.0)
Alkaline Phosphatase: 74 U/L (ref 38–126)
Anion gap: 10 (ref 5–15)
BUN: 18 mg/dL (ref 8–23)
CO2: 21 mmol/L — ABNORMAL LOW (ref 22–32)
Calcium: 9 mg/dL (ref 8.9–10.3)
Chloride: 107 mmol/L (ref 98–111)
Creatinine, Ser: 1.26 mg/dL — ABNORMAL HIGH (ref 0.61–1.24)
GFR, Estimated: 54 mL/min — ABNORMAL LOW (ref >60)
Glucose, Bld: 141 mg/dL — ABNORMAL HIGH (ref 70–99)
Potassium: 3.9 mmol/L (ref 3.5–5.1)
Sodium: 138 mmol/L (ref 135–145)
Total Bilirubin: 0.4 mg/dL (ref 0.3–1.2)
Total Protein: 7.8 g/dL (ref 6.5–8.1)

## 2022-10-27 LAB — CBC
HCT: 37.6 % — ABNORMAL LOW (ref 39.0–52.0)
Hemoglobin: 12.6 g/dL — ABNORMAL LOW (ref 13.0–17.0)
MCH: 32.7 pg (ref 26.0–34.0)
MCHC: 33.5 g/dL (ref 30.0–36.0)
MCV: 97.7 fL (ref 80.0–100.0)
Platelets: 195 10*3/uL (ref 150–400)
RBC: 3.85 MIL/uL — ABNORMAL LOW (ref 4.22–5.81)
RDW: 14 % (ref 11.5–15.5)
WBC: 5.6 10*3/uL (ref 4.0–10.5)
nRBC: 0 % (ref 0.0–0.2)

## 2022-10-27 LAB — CK: Total CK: 452 U/L — ABNORMAL HIGH (ref 49–397)

## 2022-10-27 LAB — PROTIME-INR
INR: 1 (ref 0.8–1.2)
Prothrombin Time: 13.6 seconds (ref 11.4–15.2)

## 2022-10-27 MED ORDER — ACETAMINOPHEN 650 MG RE SUPP: 650.0000 mg | RECTAL | Status: DC | PRN

## 2022-10-27 MED ORDER — METOPROLOL TARTRATE 25 MG PO TABS
25.0000 mg | ORAL_TABLET | Freq: Two times a day (BID) | ORAL | Status: DC
  Administered 2022-10-28 – 2022-11-01 (×10): 25 mg via ORAL
  Filled 2022-10-27 (×10): qty 1

## 2022-10-27 MED ORDER — HYDRALAZINE HCL 20 MG/ML IJ SOLN: 5.0000 mg | INTRAMUSCULAR | Status: DC | PRN

## 2022-10-27 MED ORDER — POLYETHYLENE GLYCOL 3350 17 G PO PACK: 17.0000 g | PACK | Freq: Every day | ORAL | Status: DC | PRN

## 2022-10-27 MED ORDER — ACETAMINOPHEN 160 MG/5ML PO SOLN: 650.0000 mg | ORAL | Status: DC | PRN

## 2022-10-27 MED ORDER — LISINOPRIL 10 MG PO TABS: 5.0000 mg | ORAL_TABLET | Freq: Every day | ORAL | Status: DC

## 2022-10-27 MED ORDER — LORAZEPAM 2 MG/ML IJ SOLN: 2.0000 mg | Freq: Once | INTRAMUSCULAR | Status: DC

## 2022-10-27 MED ORDER — ATORVASTATIN CALCIUM 80 MG PO TABS
80.0000 mg | ORAL_TABLET | Freq: Every day | ORAL | Status: DC
  Administered 2022-10-27: 80 mg via ORAL
  Filled 2022-10-27: qty 2

## 2022-10-27 MED ORDER — SODIUM CHLORIDE 0.9 % IV SOLN
2500.0000 mg | Freq: Once | INTRAVENOUS | Status: AC
  Administered 2022-10-27: 2500 mg via INTRAVENOUS
  Filled 2022-10-27: qty 25

## 2022-10-27 MED ORDER — LEVETIRACETAM 500 MG PO TABS
500.0000 mg | ORAL_TABLET | Freq: Two times a day (BID) | ORAL | Status: DC
  Administered 2022-10-28 – 2022-11-01 (×9): 500 mg via ORAL
  Filled 2022-10-27 (×9): qty 1

## 2022-10-27 MED ORDER — AMLODIPINE BESYLATE 5 MG PO TABS
5.0000 mg | ORAL_TABLET | Freq: Every day | ORAL | Status: DC
  Administered 2022-10-27 – 2022-11-01 (×6): 5 mg via ORAL
  Filled 2022-10-27 (×6): qty 1

## 2022-10-27 MED ORDER — STROKE: EARLY STAGES OF RECOVERY BOOK
Freq: Once | Status: AC
  Filled 2022-10-27: qty 1

## 2022-10-27 MED ORDER — HALOPERIDOL LACTATE 5 MG/ML IJ SOLN
INTRAMUSCULAR | Status: AC
  Administered 2022-10-27: 2 mg via INTRAVENOUS
  Filled 2022-10-27: qty 1

## 2022-10-27 MED ORDER — LORAZEPAM 2 MG/ML IJ SOLN
2.0000 mg | Freq: Once | INTRAMUSCULAR | Status: AC | PRN
  Administered 2022-10-27: 2 mg via INTRAMUSCULAR
  Filled 2022-10-27: qty 1

## 2022-10-27 MED ORDER — QUETIAPINE FUMARATE 50 MG PO TABS
25.0000 mg | ORAL_TABLET | Freq: Two times a day (BID) | ORAL | Status: DC
  Administered 2022-10-28 – 2022-11-01 (×10): 25 mg via ORAL
  Filled 2022-10-27 (×10): qty 1

## 2022-10-27 MED ORDER — ACETAMINOPHEN 325 MG PO TABS
650.0000 mg | ORAL_TABLET | ORAL | Status: DC | PRN
  Administered 2022-10-30 – 2022-10-31 (×2): 650 mg via ORAL
  Filled 2022-10-27 (×2): qty 2

## 2022-10-27 MED ORDER — IOHEXOL 350 MG/ML SOLN
75.0000 mL | Freq: Once | INTRAVENOUS | Status: AC | PRN
  Administered 2022-10-27: 75 mL via INTRAVENOUS

## 2022-10-27 MED ORDER — PANTOPRAZOLE SODIUM 40 MG IV SOLR: 40.0000 mg | Freq: Every day | INTRAVENOUS | Status: DC

## 2022-10-27 MED ORDER — HALOPERIDOL LACTATE 5 MG/ML IJ SOLN: 2.0000 mg | Freq: Once | INTRAMUSCULAR | Status: AC

## 2022-10-27 MED ORDER — SENNOSIDES-DOCUSATE SODIUM 8.6-50 MG PO TABS
1.0000 | ORAL_TABLET | Freq: Two times a day (BID) | ORAL | Status: DC
  Administered 2022-10-28 – 2022-11-01 (×9): 1 via ORAL
  Filled 2022-10-27 (×9): qty 1

## 2022-10-27 NOTE — Assessment & Plan Note (Deleted)
-  CBGs w/ meals and bedtime - add SSI if indicated

## 2022-10-27 NOTE — Assessment & Plan Note (Deleted)
-  Fall precautions -orthostatic vitals -Cardiac monitoring -PT/OT

## 2022-10-27 NOTE — ED Notes (Signed)
Provider at bedside.

## 2022-10-27 NOTE — Consult Note (Signed)
NEUROLOGY CONSULTATION NOTE   Date of service: October 27, 2022 Patient Name: Samuel Carr MRN:  [id number] DOB:  05-04-1930 Reason for consult: ICH Requesting physician: Dr. 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  Neurosurgery was consulted overnight for suspected posttraumatic ICH and recommended repeat head CT at 6 hours and if stable he can be discharged home.  Head CT at 6 hours was stable from prior.  CTA head showed no aneurysm or vascular malformation.  ED consulted neurology for additional recommendations.  I spoke with his daughter on the phone who reported that she was in the house but not with him when she heard a thud and came in his room and found him on the floor. When she came in the room she was sitting up but given the fall was unwitnessed she is not sure if he hit his head. He ambulates with a walker at home but has frequent falls. At baseline he is typically oriented to self, family members, and sometimes place.   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 He was admitted for nontraumatic IPH w/ overlying SAH to [facility] in 2022.  Most recent MRI brain 09/23/20 (which was prior to [facility] admission for ICH in 2022) showed multifocal blood products and chronic microhemorrhages, cannot exclude amyloid angiopathy (personal review).  His SBP has been controlled largely below 160 in ED without need for cleviprex gtt. He has not received his home amlodipine, lisinopril, or metoprolol. He has hx a fib but is not  on anticoagulation 2/2 prior ICH. He passed his swallow study in ED.   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    as per son, family is concerned about dementia   Diabetes mellitus without complication (HCC)    Hyperlipidemia    Past Surgical History:  Procedure Laterality Date   CYST REMOVAL TRUNK     Family History  Problem Relation Age of Onset   Alzheimer's disease Sister    Dementia Sister    Cerebral aneurysm Sister    Social History   Socioeconomic History   Marital status: Widowed    Spouse name: Not on file   Number of children: Not on file   Years of education: Not on file   Highest education level: Not on file  Occupational History   Not on file  Tobacco Use   Smoking status: Never   Smokeless tobacco: Never  Substance and Sexual Activity   Alcohol use: Never   Drug use: Never   Sexual activity: Not on file  Other Topics Concern   Not on file  Social History Narrative   Right handed   Social Determinants of Health   Financial Resource Strain: Not on file  Food Insecurity: Not on file  Transportation Needs: Not on file  Physical Activity: Not on file  Stress: Not on file  Social  Connections: Not on file   No Known Allergies  Medications   (Not in a hospital admission)    No current facility-administered medications for this encounter.  Current Outpatient Medications:    atorvastatin (LIPITOR) 80 MG tablet, Take 80 mg by mouth daily., Disp: , Rfl:    levETIRAcetam (KEPPRA) 500 MG tablet, Take 500 mg by mouth 2 (two) times daily., Disp: , Rfl:    lisinopril (ZESTRIL) 5 MG tablet, Take 5 mg by mouth daily., Disp: , Rfl:    metFORMIN (GLUCOPHAGE) 500 MG tablet, Take 1 tablet (500 mg total) by mouth 2 (two) times daily with a meal., Disp: 60 tablet, Rfl: 1   metoprolol tartrate (LOPRESSOR) 25 MG tablet, Take 25 mg by mouth 2 (two) times  daily., Disp: , Rfl:    QUEtiapine (SEROQUEL) 25 MG tablet, Take 25 mg by mouth 2 (two) times daily., Disp: , Rfl:    amLODipine (NORVASC) 5 MG tablet, Take by mouth., Disp: , Rfl:    atorvastatin (LIPITOR) 40 MG tablet, Take 1 tablet (40 mg total) by mouth daily. (Patient not taking: Reported on 10/27/2022), Disp: 90 tablet, Rfl: 1  Vitals   Vitals:   10/27/22 1415 10/27/22 1430 10/27/22 1432 10/27/22 1500  BP:  (!) 171/95 (!) 171/95 (!) 165/81  Pulse: 80 83 77 67  Resp: 15 13 16 11   Temp:  98 F (36.7 C)    TempSrc:      SpO2: 100% 100% 100% 100%  Weight:      Height:         Body mass index is 40.39 kg/m.  Physical Exam   Physical Exam Gen: alert, oriented to self only, difficulty following commands 2/2 near deafness, able to mimic Resp: normal WOB CV: extremities appear well-perfused  Neuro: *MS: alert, oriented to self only, difficulty following commands 2/2 near deafness, able to mimic *Speech: severe dysarthria (edentulous), UTA aphasia 2/2 difficulty following commands *CN: PERRL, blinks to threat bilat, EOMI, face symmetric, nearly deaf *Motor: Drift to bed RUE, drift but not to bed all other extrem *Sensory: SILT *Reflexes: 1+ throughout, toes mute bilat *Coordination, gait: UTA  NIHSS  1a Level of Conscious.: 0 1b LOC Questions: 2 1c LOC Commands: 1 2 Best Gaze: 0 3 Visual: 0 4 Facial Palsy: 0 5a Motor Arm - left: 1 5b Motor Arm - Right: 2 6a Motor Leg - Left: 1 6b Motor Leg - Right: 1 7 Limb Ataxia: 0 8 Sensory: 0 9 Best Language: 0 10 Dysarthria: 2 11 Extinct. and Inatten.: 0  TOTAL: 10  Premorbid mRS = 4  ICH score = 1   Labs   CBC:  Recent Labs  Lab 10/26/22 2358  WBC 5.6  HGB 12.6*  HCT 37.6*  MCV 97.7  PLT 998    Basic Metabolic Panel:  Lab Results  Component Value Date   NA 138 10/26/2022   K 3.9 10/26/2022   CO2 21 (L) 10/26/2022   GLUCOSE 141 (H) 10/26/2022   BUN 18 10/26/2022   CREATININE 1.26 (H) 10/26/2022    CALCIUM 9.0 10/26/2022   GFRNONAA 54 (L) 10/26/2022   Lipid Panel: No results found for: "LDLCALC" HgbA1c:  Lab Results  Component Value Date   HGBA1C 6.1 (H) 09/22/2020   Urine Drug Screen: No results found for: "LABOPIA", "COCAINSCRNUR", "LABBENZ", "AMPHETMU", "THCU", "LABBARB"  Alcohol Level No results found for: "ETH"   Impression   This is a 89 year old gentleman with a past medical history significant for nontraumatic  ICH in 2022 with subsequent seizure disorder, a fib not on AC 2/2 prior ICH, dementia, DM2, and hyperlipidemia who presents after unwitnessed fall and was found to have a left frontal cortical hematoma with overlying subarachnoid hemorrhage and regional edema.  Neurosurgery was consulted overnight for suspected posttraumatic ICH and recommended repeat head CT at 6 hours and if stable he can be discharged home.    Head CT at 6 hours was stable from prior.  CTA head showed no aneurysm or vascular malformation. SBP has been largely controlled under 160 in ED without prns despite not receiving his 3 home BP medications earlier today. He has passed his bedside swallow study and his home oral antihypertensives can be safely resumed. As such he is appropriate for admission to hospitalist service for stroke workup and PT/OT/SLP eval. He has no ICU indications at this time.   He has a history of nontraumatic IPH w/ overlying SAH in 2022 ([facility]) and multifocal blood products on most recent MRI brain in 2021. As such etiology of current ICH could be traumatic or alternatively could be spontaneous potentially 2/2 underlying amyloid angiopathy.   Recommendations   - Given stable head CT at 6 hrs, no aneurysm or vascular malformation, and SBP mostly under 160 in ED despite not receiving his 3 home oral antihypertensives he is appropriate for admission to the hospitalist service; currently no indication for ICU - Patient passed bedside swallow screen - Restart home amlodipine,  lisinopril, and metoprolol. Goal SBP <150, may use IV labetalol or hydralazine prn for BP outside these parameters - No antiplatelets, pharmacologic DVT prophylaxis, or anticoagulation - SCDs for DVT prophylaxis - Patient missed AM keppra dose, will load with 20mg /kg and continue home keppra 500mg  bid - HOB elevated 30 degrees - MRI brain wo contrast - PT/OT/SLP - Stroke team will follow tomorrow  ______________________________________________________________________   Thank you for the opportunity to take part in the care of this patient. If you have any further questions, please contact the neurology consultation attending.  Signed,  Su Monks, MD [facility] [phone]  If 7pm- 7am, please page neurology on call as listed in [place].  **Any copied and pasted documentation in this note was written by me in another application not billed for and pasted by me into this document.

## 2022-10-27 NOTE — ED Provider Notes (Signed)
89 yo male here with fall at home, small intraparenchymal hemorrhage on initial CT, NSGY consulted and recommending repeat CT head at 8 hours, if stable patient could be discharged home  Physical Exam  BP (!) 165/81   Pulse 67   Temp 98 F (36.7 C)   Resp 11   Ht 6' (1.829 m)   Wt 135.1 kg   SpO2 100%   BMI 40.39 kg/m   Physical Exam  Procedures  Procedures  ED Course / MDM   Clinical Course as of 10/27/22 1528  Sat Oct 27, 2022  1105 Neurology consulted, requesting CTA head and they will consult on patient. [MT]  1505 Pt signed out to Dr Darl Householder EDP pending CTA head.  If no acute vascular findings he can be admitted to medicine service.  If vascular pathology, will need to re-discuss with neurology [MT]    Clinical Course User Index [MT] Kimberlea Schlag, Carola Rhine, MD   Medical Decision Making Amount and/or Complexity of Data Reviewed Labs: ordered. Radiology: ordered.  Risk Prescription drug management. Decision regarding hospitalization.   Patient reassessed after second CT scan.  He remains quite confused, not able to get out of bed.  His daughter and granddaughter at bedside reports that this is an abnormal change for him, as normally he is ambulating, able to feed himself at home, although does have significant dementia.  Given his change in his cognitive and physical function, we will plan for medical admission for PT evaluation.  Family also reports the patient has a history of intraparenchymal hemorrhage from stroke.       Wyvonnia Dusky, MD 10/27/22 [phone]

## 2022-10-27 NOTE — Discharge Instructions (Addendum)
Samuel Carr was diagnosed with a small brain bleed on his CT scan.  Our neurosurgeons had recommended that we keep Samuel Carr in observation in the ER and repeat a CT scan. Thankfully, his repeat CT scan did not show any signs of worsening bleeding, or a need for surgery.  He can follow-up as an outpatient with the neurosurgeons.  Please contact the office number above to schedule follow-up appointment.  If Samuel Carr has worsening lethargy, confusion, somnolence, persistent vomiting, or any strokelike symptoms, please return Samuel Carr to the emergency department as soon as possible for another evaluation.

## 2022-10-27 NOTE — ED Notes (Signed)
Pt back from CT

## 2022-10-27 NOTE — Assessment & Plan Note (Addendum)
-  Delirium precautions -Continue home Seroquel for sleep

## 2022-10-27 NOTE — Assessment & Plan Note (Addendum)
Goal SBP <150. -Continue home metoprolol, amlodipine -IV hydralazine as needed to maintain goal

## 2022-10-27 NOTE — ED Provider Notes (Signed)
[facility] Emergency Department Provider Note MRN:  [id number]  Arrival date & time: 10/27/22     Chief Complaint   Fall   History of Present Illness   Samuel Carr is a 89 y.o. year-old male with a history of diabetes, dementia presenting to the ED with chief complaint of fall.  Patient lives at home with daughter and son-in-law, who are both working today.  They think he fell a few times today.  Was found on the ground, could not get up.  Review of Systems  A thorough review of systems was obtained and all systems are negative except as noted in the HPI and PMH.   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    as per son, family is concerned about dementia   Diabetes mellitus without complication ([place])    Hyperlipidemia     Past Surgical History:  Procedure Laterality Date   CYST REMOVAL TRUNK      Family History  Problem Relation Age of Onset   Alzheimer's disease Sister    Dementia Sister    Cerebral aneurysm Sister     Social History   Socioeconomic History   Marital status: Widowed    Spouse name: Not on file   Number of children: Not on file   Years of education: Not on file   Highest education level: Not on file  Occupational History   Not on file  Tobacco Use   Smoking status: Never   Smokeless tobacco: Never  Substance and Sexual Activity   Alcohol use: Never   Drug use: Never   Sexual activity: Not on file  Other Topics Concern   Not on file  Social History Narrative   Right handed   Social Determinants of Health   Financial Resource Strain: Not on file  Food Insecurity: Not on file  Transportation Needs: Not on file  Physical Activity: Not on file  Stress: Not on file  Social Connections: Not on file  Intimate Partner Violence: Not on file     Physical Exam   Vitals:   10/27/22 0145 10/27/22 0148  BP: (!) 158/76   Pulse: 69   Resp: 17   Temp:   97.7 F (36.5 C)  SpO2: 100%     CONSTITUTIONAL: Well-appearing, NAD NEURO/PSYCH: Awake and alert, oriented to name, moves all extremities equally EYES:  eyes equal and reactive ENT/NECK:  no LAD, no JVD CARDIO: Regular rate, well-perfused, normal S1 and S2 PULM:  CTAB no wheezing or rhonchi GI/GU:  non-distended, non-tender MSK/SPINE:  No gross deformities, edema to bilateral lower extremities, left greater than right SKIN:  no rash, atraumatic   *Additional and/or pertinent findings included in MDM below  Diagnostic and Interventional Summary    EKG Interpretation  Date/Time:  Saturday October 27 2022 00:28:48 EST Ventricular Rate:  69 PR Interval:  194 QRS Duration: 136 QT Interval:  472 QTC Calculation: 505 R Axis:   56 Text Interpretation: Normal sinus rhythm with sinus arrhythmia Left bundle branch block Abnormal ECG When compared with ECG of 21-Sep-2020 22:12, PREVIOUS ECG IS PRESENT Confirmed by Gerlene Fee [phone]) on 10/27/2022 1:31:58 AM       Labs Reviewed  CBC - Abnormal; Notable for the following components:      Result Value   RBC 3.85 (*)    Hemoglobin 12.6 (*)    HCT 37.6 (*)    All other components within normal  limits  COMPREHENSIVE METABOLIC PANEL - Abnormal; Notable for the following components:   CO2 21 (*)    Glucose, Bld 141 (*)    Creatinine, Ser 1.26 (*)    GFR, Estimated 54 (*)    All other components within normal limits  PROTIME-INR  CK    DG Tibia/Fibula Left  Final Result    DG Hip Unilat W or Wo Pelvis 2-3 Views Left  Final Result    CT HEAD WO CONTRAST (5MM)  Final Result    CT Cervical Spine Wo Contrast  Final Result    CT HEAD WO CONTRAST (5MM)    (Results Pending)    Medications - No data to display   Procedures  /  Critical Care .Critical Care  Performed by: Maudie Flakes, MD Authorized by: Maudie Flakes, MD   Critical care provider statement:    Critical care time (minutes):  35   Critical care was  necessary to treat or prevent imminent or life-threatening deterioration of the following conditions:  CNS failure or compromise (Intracranial bleeding)   Critical care was time spent personally by me on the following activities:  Development of treatment plan with patient or surrogate, discussions with consultants, evaluation of patient's response to treatment, examination of patient, ordering and review of laboratory studies, ordering and review of radiographic studies, ordering and performing treatments and interventions, pulse oximetry, re-evaluation of patient's condition and review of old charts   ED Course and Medical Decision Making  Initial Impression and Ddx Fall, triage evaluation most notable for intracranial bleeding on head CT.  Patient with normal vital signs, he is at his cognitive baseline per son-in-law at bedside.  History of dementia.  He is moving his head/neck normally, currently denying any discomfort.  Clear lungs, soft abdomen, no focal neurological deficits.  Will discuss imaging results with neurosurgery.  Past medical/surgical history that increases complexity of ED encounter: Dementia  Interpretation of Diagnostics I personally reviewed the EKG and my interpretation is as follows: Sinus rhythm  Labs reassuring with no significant blood count or electrolyte disturbance  Patient Reassessment and Ultimate Disposition/Management     Imaging reviewed and discussed with Dr. Kathyrn Sheriff of neurosurgery, plan is for repeat CT head at the 8-hour mark and if stable would be appropriate for discharge.  Signed out to oncoming provider at shift change.  Patient management required discussion with the following services or consulting groups:  Neurosurgery  Complexity of Problems Addressed Acute illness or injury that poses threat of life of bodily function  Additional Data Reviewed and Analyzed Further history obtained from: Further history from spouse/family  member  Additional Factors Impacting ED Encounter Risk Consideration of hospitalization  Barth Kirks. [place] Small, [place] [email or web address]  Final Clinical Impressions(s) / ED Diagnoses     ICD-10-CM   1. Intracranial bleeding (HCC)  I62.9     2. Fall, initial encounter  W19.[facility]       ED Discharge Orders     None        Discharge Instructions Discussed with and Provided to Patient:   Discharge Instructions   None      Maudie Flakes, MD 10/27/22 [phone]

## 2022-10-27 NOTE — Assessment & Plan Note (Addendum)
-  Goal SBP <140 - Fall precautions

## 2022-10-27 NOTE — H&P (Addendum)
Hospital Admission History and Physical Service Pager: [phone]  Patient name: Samuel Carr Medical record number: [id number] Date of Birth: [birth year] Age: 89 y.o. Gender: male  Primary Care Provider: Loura Pardon, MD Consultants: Neurology Code Status: Full Code Preferred Emergency Contact: Leona Singleton  and Marcina Millard: [phone] Contact Information     Name Relation Home Work [place] Daughter [phone]  [phone]   BRYAN, GOIN   [phone]        Chief Complaint: Fall  Assessment and Plan: Samuel Carr is a 89 y.o. male presenting after a fall and found to have stable subarachnoid hemorrhage.  Neurology is following and recommended admission for observation and further evaluation.    * Subarachnoid hemorrhage [facility]) Neurology consulted, will follow recommendations.  Patient currently at baseline mental status and passed swallow test. -Admit to MedSurg, attending Dr. Wendy Poet -f/u brain MRI -f/u PT/OT recs -f/u stroke team recs -Goal SBP<150 -Fall precautions  Essential hypertension Goal SBP <150. -Continue home metoprolol, amlodipine -IV hydralazine as needed to maintain goal  Seizure disorder [facility]) Patient has had seizure disorder since intracranial hemorrhage in 2022. -Continue home Keppra 500 mg twice daily -Seizure precautions  Fall Unsure why patient fell.  Unsure if it was syncopal.  Will continue telemetry for now. -Fall precautions -orthostatic vitals -Cardiac monitoring -PT/OT  Dementia [facility]) Patient has dementia at baseline.  He is on Seroquel to help with sleep and mood. -Delirium precautions -Continue home Seroquel  Diabetes mellitus without complication ([place]) Well-controlled on home metformin twice daily.  Last A1c of 6.1.  Will hold metformin for now while monitoring kidney function and potential need for contrast.  Can consider adding sliding scale with CBGs if needed.    Chronic and  stable conditions A-fib-not on anticoagulant, home metoprolol continued HLD-Home atorvastatin continued   FEN/GI: Regular diet VTE Prophylaxis: SCDs  Disposition: Med surg  History of Present Illness:  Samuel Carr is a 89 y.o. male presenting after being found down at home.  History provided by son. Pt was found on the floor by his daughter in his bedroom around 10:00 pm 1/19. Family thinks he wasn't down long, no longer than 30 minutes.  No one saw him fall.  Has complained of L leg pain. No sick symptoms recently. No difficulty with urinating or defecating.  Son states he is at his baseline mental status, has dementia.  Also states that normally his father is able to get up and walk around with walker which he has not been able to do since being in the hospital.  Of note, patient was hospitalized in 2022 for nontraumatic intracranial hemorrhage and subsequent seizure disorder for which he takes Keppra.  In the ED, head CT showed left frontal hematoma with subarachnoid hemorrhage, neurosurgery was consulted and recommended repeat head CT showing the hemorrhage is stable.  Patient was unable to mobilize at his baseline and CTA was ordered which showed no AVM or large vessel occlusion.  Neurology did recommend admission for observation, MRI brain and PT/OT.  He was loaded with Keppra as he had missed his last dose and resumed his home Keppra dosing.  In ED, x-ray of left leg showed no acute fracture or dislocation, did show soft tissue swelling of left ankle and left knee effusion and hip x-ray showed degenerative changes of bilateral hips.  Review Of Systems: Per HPI   Pertinent Past Medical History: A-fib-not on anticoagulant Dementia T2DM HLD Remainder reviewed in history tab.   Pertinent Past  Surgical History: Knee replacement in 2021 Remainder reviewed in history tab.  Pertinent Social History: Tobacco use: No Alcohol use: No Other Substance use: No Lives with  daughter  Pertinent Family History: Dementia Remainder reviewed in history tab.   Important Outpatient Medications: As noted above Remainder reviewed in medication history.   Objective: BP (!) 167/89   Pulse 74   Temp 98 F (36.7 C)   Resp 14   Ht 6' (1.829 m)   Wt 135.1 kg   SpO2 100%   BMI 40.39 kg/m  Exam: General: 89 year old male, pleasant to speak with, NAD Eyes: White sclera, clear conjunctiva ENTM: MMM Cardiovascular: Irregular- PVC's on monitor, no murmur Respiratory: CTAB, breathing comfortably Gastrointestinal: Soft, nondistended, nontender to palpation Extremities: No BLE edema Derm: Warm and dry Neuro: Alert, unable to follow commands due to dementia, no focal deficits   Labs:  CBC BMET  Recent Labs  Lab 10/26/22 2358  WBC 5.6  HGB 12.6*  HCT 37.6*  PLT 195   Recent Labs  Lab 10/26/22 2358  NA 138  K 3.9  CL 107  CO2 21*  BUN 18  CREATININE 1.26*  GLUCOSE 141*  CALCIUM 9.0     EKG: My own interpretation (not copied from electronic read) NSR with sinus arhythmia       Precious Gilding, DO 10/27/2022, 7:20 PM PGY-2, [place] Intern pager: [phone], text pages welcome Secure chat group [facility] Teaching Service

## 2022-10-27 NOTE — Progress Notes (Addendum)
Received notice from neurology that patient would need repeat head CT to assess stability of the bleed, given MRI demonstrated questionable enlargement.   Vitals and exam while on 6N are below: Blood pressure (!) 165/62, pulse 77, temperature 98.1 F (36.7 C), temperature source Oral, resp. rate 20, height 6' (1.829 m), weight 135.1 kg, SpO2 100 %.  Gen: awake, alert, NAD, verbalizes no pain or discomfort, says "I'm fine" Cardiac: RRR, hands cool to touch Resp: CTAB anteriorly Abd: soft, non-tender  I personally accompanied patient down to CT with the nurse.   CT head has been reviewed, per neurology the bleed is stable. I will change this patient to progressive status given he is ordered for Q1 neuro stroke assessments.   Ezequiel Essex, MD

## 2022-10-27 NOTE — ED Notes (Signed)
Pt to CT

## 2022-10-27 NOTE — ED Notes (Signed)
ED TO INPATIENT HANDOFF REPORT  ED Nurse Name and Phone #: Osvaldo Shipper RN [phone]  S Name/Age/Gender Samuel Carr 89 y.o. male Room/Bed: 026C/026C  Code Status   Code Status: Full Code  Triage Complete: Triage complete  Chief Complaint Fall [W19.XXXA]  Triage Note Daughter reported she found the patient on the floor at home this evening , patient unable to recall incident , history of dementia , mild left lower leg pain . He uses a walker to ambulate at home , he is not taking anticoagulant .    Allergies No Known Allergies  Level of Care/Admitting Diagnosis ED Disposition     ED Disposition  Admit   Condition  --   Comment  Hospital Area: [facility] [[id number]]  Level of Care: Med-Surg [16]  May place patient in observation at [facility] or [facility] if equivalent level of care is available:: Yes  Covid Evaluation: Asymptomatic - no recent exposure (last 10 days) testing not required  Diagnosis: Fall [290176]  Admitting Physician: Eduardo Osier  Attending Physician: MCDIARMID, TODD D [[id number]]          B Medical/Surgery History Past Medical History:  Diagnosis Date   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    as per son, family is concerned about dementia   Diabetes mellitus without complication (HCC)    Hyperlipidemia    Past Surgical History:  Procedure Laterality Date   CYST REMOVAL TRUNK       A IV Location/Drains/Wounds Patient Lines/Drains/Airways Status     Active Line/Drains/Airways     Name Placement date Placement time Site Days   Peripheral IV 10/27/22 20 G Left;Posterior Hand 10/27/22  0201  Hand  less than 1   Peripheral IV 10/27/22 20 G 1.88" Anterior;Left Forearm 10/27/22  1341  Forearm  less than 1            Intake/Output Last 24 hours No intake or output data in the 24 hours ending 10/27/22 1805  Labs/Imaging Results for orders placed or performed during the hospital encounter of 10/26/22 (from the  past 48 hour(s))  CBC     Status: Abnormal   Collection Time: 10/26/22 11:58 PM  Result Value Ref Range   WBC 5.6 4.0 - 10.5 K/uL   RBC 3.85 (L) 4.22 - 5.81 MIL/uL   Hemoglobin 12.6 (L) 13.0 - 17.0 g/dL   HCT 35.7 (L) 01.7 - 79.3 %   MCV 97.7 80.0 - 100.0 fL   MCH 32.7 26.0 - 34.0 pg   MCHC 33.5 30.0 - 36.0 g/dL   RDW 90.3 00.9 - 23.3 %   Platelets 195 150 - 400 K/uL   nRBC 0.0 0.0 - 0.2 %    Comment: Performed at [facility], [address], [place], [place] [address]  Comprehensive metabolic panel     Status: Abnormal   Collection Time: 10/26/22 11:58 PM  Result Value Ref Range   Sodium 138 135 - 145 mmol/L   Potassium 3.9 3.5 - 5.1 mmol/L   Chloride 107 98 - 111 mmol/L   CO2 21 (L) 22 - 32 mmol/L   Glucose, Bld 141 (H) 70 - 99 mg/dL    Comment: Glucose reference range applies only to samples taken after fasting for at least 8 hours.   BUN 18 8 - 23 mg/dL   Creatinine, Ser 2.63 (H) 0.61 - 1.24 mg/dL   Calcium 9.0 8.9 - 33.5 mg/dL   Total Protein 7.8 6.5 - 8.1 g/dL  Albumin 3.7 3.5 - 5.0 g/dL   AST 28 15 - 41 U/L   ALT 19 0 - 44 U/L   Alkaline Phosphatase 74 38 - 126 U/L   Total Bilirubin 0.4 0.3 - 1.2 mg/dL   GFR, Estimated 54 (L) >60 mL/min    Comment: (NOTE) Calculated using the CKD-EPI Creatinine Equation (2021)    Anion gap 10 5 - 15    Comment: Performed at [facility] [address], [place], [place] [address]  Protime-INR     Status: None   Collection Time: 10/27/22  2:06 AM  Result Value Ref Range   Prothrombin Time 13.6 11.4 - 15.2 seconds   INR 1.0 0.8 - 1.2    Comment: (NOTE) INR goal varies based on device and disease states. Performed at [facility], [place] [address], [place], [place] [address]   CK     Status: Abnormal   Collection Time: 10/27/22  2:06 AM  Result Value Ref Range   Total CK 452 (H) 49 - 397 U/L    Comment: Performed at [facility], [place] [address], [place], [place] [address]   CT ANGIO HEAD NECK W WO  CM  Result Date: 10/27/2022 CLINICAL DATA:  Cerebral aneurysm screening, high-risk EXAM: CT ANGIOGRAPHY HEAD AND NECK TECHNIQUE: Multidetector CT imaging of the head and neck was performed using the standard protocol during bolus administration of intravenous contrast. Multiplanar CT image reconstructions and MIPs were obtained to evaluate the vascular anatomy. Carotid stenosis measurements (when applicable) are obtained utilizing NASCET criteria, using the distal internal carotid diameter as the denominator. RADIATION DOSE REDUCTION: This exam was performed according to the departmental dose-optimization program which includes automated exposure control, adjustment of the mA and/or kV according to patient size and/or use of iterative reconstruction technique. CONTRAST:  13mL OMNIPAQUE IOHEXOL 350 MG/ML SOLN COMPARISON:  CT head the same day FINDINGS: CTA NECK FINDINGS Aortic arch: Great vessel origins are patent. Right carotid system: Atherosclerosis at the carotid bifurcation without greater than 50% stenosis. Left carotid system: Atherosclerosis at the carotid bifurcation without greater than 50% stenosis. Vertebral arteries: Right dominant. Both vertebral arteries are patent without significant (greater than 50%) stenosis. Skeleton: Severe multilevel degenerative change including degenerative disc disease, facet and uncovertebral hypertrophy with varying degrees of neural foraminal stenosis Other neck: No acute findings on limited assessment. Upper chest: Visualized lung apices are clear. Review of the MIP images confirms the above findings CTA HEAD FINDINGS Anterior circulation: Bilateral intracranial ICAs are patent with mild for age narrowing due to calcific atherosclerosis. Bilateral MCAs and ACAs are patent without proximal hemodynamically significant stenosis. No visible aneurysm or arteriovenous malformation. Blood products in the region of acute hemorrhage does limit assessment. Posterior circulation:  Bilateral intradural vertebral arteries, basilar artery and bilateral posterior cerebral arteries are patent without proximal hemodynamically significant stenosis. No aneurysm identified. Venous sinuses: As permitted by contrast timing, patent. review of the MIP images confirms the above findings IMPRESSION: 1. No large vessel occlusion or proximal hemodynamically significant stenosis 2. No visible aneurysm arteriovenous malformation, although acute blood products does limit assessment. Electronically Signed   By: Margaretha Sheffield M.D.   On: 10/27/2022 16:56   CT HEAD WO CONTRAST (5MM)  Result Date: 10/27/2022 CLINICAL DATA:  Follow-up hemorrhage on blood thinners. EXAM: CT HEAD WITHOUT CONTRAST TECHNIQUE: Contiguous axial images were obtained from the base of the skull through the vertex without intravenous contrast. RADIATION DOSE REDUCTION: This exam was performed according  to the departmental dose-optimization program which includes automated exposure control, adjustment of the mA and/or kV according to patient size and/or use of iterative reconstruction technique. COMPARISON:  Earlier today FINDINGS: Brain: High left posterior frontal hematoma with overlying subarachnoid hemorrhage and regional edema, the dominant parenchymal component measuring up to 2 cm on coronal reformats, unchanged. Bifrontal encephalomalacia involving cortex. No acute infarct, hydrocephalus, or mass. Vascular: No hyperdense vessel or unexpected calcification. Skull: Normal. Negative for fracture or focal lesion. Sinuses/Orbits: No acute finding. IMPRESSION: No progression of the left frontal hematoma with overlying subarachnoid hemorrhage. Electronically Signed   By: Jorje Guild M.D.   On: 10/27/2022 09:30   DG Hip Unilat W or Wo Pelvis 2-3 Views Left  Result Date: 10/27/2022 CLINICAL DATA:  Patient found on the floor with mid lower left leg pain. EXAM: DG HIP (WITH OR WITHOUT PELVIS) 2-3V LEFT COMPARISON:  None Available.  FINDINGS: There is no evidence of an acute hip fracture or dislocation. Marked severity degenerative changes are seen involving the right hip with moderate severity degenerative changes involving the left hip. This is seen in the form of joint space narrowing, acetabular sclerosis and lateral bony spurring. Mild flattening of the right femoral head is also noted. IMPRESSION: 1. No acute osseous abnormality. 2. Degenerative changes involving the bilateral hips, right greater than left. Electronically Signed   By: Virgina Norfolk M.D.   On: 10/27/2022 01:12   DG Tibia/Fibula Left  Result Date: 10/27/2022 CLINICAL DATA:  Status post fall. EXAM: LEFT TIBIA AND FIBULA - 2 VIEW COMPARISON:  None Available. FINDINGS: There is no evidence of an acute fracture or dislocation. Marked severity degenerative changes are seen within the visualized portion of the left knee. A moderate sized left knee effusion is noted. Moderate severity medial and lateral soft tissue swelling is seen within the left ankle. IMPRESSION: 1. No acute fracture or dislocation. 2. Moderate severity medial and lateral soft tissue swelling within the left ankle. 3. Moderate size left knee effusion. Electronically Signed   By: Virgina Norfolk M.D.   On: 10/27/2022 01:10   CT Cervical Spine Wo Contrast  Result Date: 10/27/2022 CLINICAL DATA:  Neck trauma (Age >= 65y) EXAM: CT CERVICAL SPINE WITHOUT CONTRAST TECHNIQUE: Multidetector CT imaging of the cervical spine was performed without intravenous contrast. Multiplanar CT image reconstructions were also generated. RADIATION DOSE REDUCTION: This exam was performed according to the departmental dose-optimization program which includes automated exposure control, adjustment of the mA and/or kV according to patient size and/or use of iterative reconstruction technique. COMPARISON:  09/21/2020 FINDINGS: Alignment: 2-3 mm retrolisthesis C6-7 and 2 mm anterolisthesis C4-5, likely degenerative in nature.  This appears stable since prior examination. No traumatic listhesis of the cervical spine. Skull base and vertebrae: Craniocervical alignment is normal. Atlantodental interval is not widened. No acute fracture of the cervical spine. Vertebral body height is preserved. Soft tissues and spinal canal: No prevertebral fluid or swelling. No visible canal hematoma. Disc levels: There is intervertebral disc space narrowing and endplate remodeling throughout the cervical spine in keeping with changes of moderate to severe degenerative disc disease, most severe at C4-C7. Prevertebral soft tissues are not thickened on sagittal reformats. No high-grade canal stenosis. Multilevel uncovertebral and facet arthrosis results in multilevel moderate to severe bilateral neuroforaminal narrowing throughout the cervical spine. Upper chest: No acute abnormality. Other: None IMPRESSION: 1. No acute fracture or listhesis of the cervical spine. 2. Advanced multilevel degenerative disc disease and facet arthrosis resulting in multilevel moderate  to severe bilateral neuroforaminal narrowing throughout the cervical spine. Electronically Signed   By: Fidela Salisbury M.D.   On: 10/27/2022 00:53   CT HEAD WO CONTRAST (5MM)  Result Date: 10/27/2022 CLINICAL DATA:  Fall EXAM: CT HEAD WITHOUT CONTRAST TECHNIQUE: Contiguous axial images were obtained from the base of the skull through the vertex without intravenous contrast. RADIATION DOSE REDUCTION: This exam was performed according to the departmental dose-optimization program which includes automated exposure control, adjustment of the mA and/or kV according to patient size and/or use of iterative reconstruction technique. COMPARISON:  Brain MRI 09/23/2020 FINDINGS: Brain: There is intraparenchymal hematoma within the superior left frontal lobe, involving the precentral gyrus. There is overlying subarachnoid hemorrhage. The intraparenchymal component measures approximately 1.6 x 2.1 x 1.6 cm  (2.7 mL). No midline shift or other mass effect. Generalized atrophy. Bifrontal encephalomalacia in a pattern consistent with remote trauma. Vascular: Calcific atherosclerosis of the internal carotid arteries at the skull base. Skull: Normal. Negative for fracture or focal lesion. Sinuses/Orbits: No acute finding. Other: None. IMPRESSION: 1. Intraparenchymal hematoma within the superior left frontal lobe, involving the precentral gyrus, with overlying subarachnoid hemorrhage. 2. No midline shift or other mass effect. 3. Bifrontal encephalomalacia in a pattern consistent with remote trauma. Critical Value/emergent results were called by telephone at the time of interpretation on 10/27/2022 at 12:39 am to provider [facility] , who verbally acknowledged these results. Electronically Signed   By: Ulyses Jarred M.D.   On: 10/27/2022 00:39    Pending Labs Unresulted Labs (From admission, onward)    None       Vitals/Pain Today's Vitals   10/27/22 1500 10/27/22 1645 10/27/22 1654 10/27/22 1730  BP: (!) 165/81  (!) 159/79 (!) 167/89  Pulse: 67 65 77 74  Resp: 11 15 16 14   Temp:      TempSrc:      SpO2: 100% 100% 100% 100%  Weight:      Height:      PainSc:        Isolation Precautions No active isolations  Medications Medications  levETIRAcetam (KEPPRA) 2,500 mg in sodium chloride 0.9 % 250 mL IVPB (has no administration in time range)    Followed by  levETIRAcetam (KEPPRA) tablet 500 mg (has no administration in time range)   stroke: early stages of recovery book (has no administration in time range)  acetaminophen (TYLENOL) tablet 650 mg (has no administration in time range)    Or  acetaminophen (TYLENOL) 160 MG/5ML solution 650 mg (has no administration in time range)    Or  acetaminophen (TYLENOL) suppository 650 mg (has no administration in time range)  senna-docusate (Senokot-S) tablet 1 tablet (has no administration in time range)  pantoprazole (PROTONIX) injection 40 mg (has  no administration in time range)  polyethylene glycol (MIRALAX / GLYCOLAX) packet 17 g (has no administration in time range)  hydrALAZINE (APRESOLINE) injection 5 mg (has no administration in time range)  amLODipine (NORVASC) tablet 5 mg (has no administration in time range)  atorvastatin (LIPITOR) tablet 80 mg (has no administration in time range)  metoprolol tartrate (LOPRESSOR) tablet 25 mg (has no administration in time range)  QUEtiapine (SEROQUEL) tablet 25 mg (has no administration in time range)  lisinopril (ZESTRIL) tablet 5 mg (has no administration in time range)  haloperidol lactate (HALDOL) injection 2 mg (2 mg Intravenous Given 10/27/22 0346)  LORazepam (ATIVAN) injection 2 mg (2 mg Intramuscular Given 10/27/22 0347)  iohexol (OMNIPAQUE) 350 MG/ML injection 75 mL (75  mLs Intravenous Contrast Given 10/27/22 1619)    Mobility walks with person assist     Focused Assessments Neuro Assessment Handoff:  Swallow screen pass? Yes          Neuro Assessment:   Neuro Checks:      Has TPA been given? No If patient is a Neuro Trauma and patient is going to OR before floor call report to Echo nurse: [phone] or [phone]

## 2022-10-27 NOTE — Assessment & Plan Note (Addendum)
-  Continue home Keppra 500 mg twice daily -Seizure precautions

## 2022-10-27 NOTE — ED Notes (Signed)
Swallow screen completed at 1432, pt passed.

## 2022-10-27 NOTE — Progress Notes (Signed)
Brief Neuro Update:  MRI concerning for extraaxial hemorrhage overlying R occipit and R parietal lobe of unclear chronicity but likely late subacute given bright on T1 and T2 along with the known acute left posterior frontal convexity hemorrhage demonstrated on CT head.  Repeat CT Head was obtained to clarify this which shows stable left posterior frontal convexity hemorrhage with no acute hemorrhage. The noted extraaxial hemorrhage on MRI earlier are likely late subacute SAH/SDH.  Continue with plan as noted in neurology note from Dr. Livia Snellen.  [place] Pager Number [phone]

## 2022-10-27 NOTE — ED Provider Notes (Signed)
  Physical Exam  BP (!) 159/79   Pulse 77   Temp 98 F (36.7 C)   Resp 16   Ht 6' (1.829 m)   Wt 135.1 kg   SpO2 100%   BMI 40.39 kg/m   Physical Exam  Procedures  Procedures  ED Course / MDM   Clinical Course as of 10/27/22 1726  Sat Oct 27, 2022  1105 Neurology consulted, requesting CTA head and they will consult on patient. [MT]  2876 Pt signed out to Dr Darl Householder EDP pending CTA head.  If no acute vascular findings he can be admitted to medicine service.  If vascular pathology, will need to re-discuss with neurology [MT]    Clinical Course User Index [MT] Trifan, Carola Rhine, MD   Medical Decision Making Care assumed at 3 PM.  Patient was seen here yesterday for a possible unwitnessed fall.  Patient was found on the ground yesterday.  Patient has a intraparenchymal hemorrhage.  Neurosurgery recommended repeat CT head.  Repeat CT head at 9:30 AM showed subarachnoid hemorrhage that is stable.  Neurology was consulted and recommended CTA head.  5:27 PM CTA showed no aneurysmal bleed or AVM.  Dr. Quinn Axe from neurology saw patient and recommend admission and observation and PT OT and MRI brain and stroke team will continue to follow.  CRITICAL CARE Performed by: Wandra Arthurs   Total critical care time: 30 minutes  Critical care time was exclusive of separately billable procedures and treating other patients.  Critical care was necessary to treat or prevent imminent or life-threatening deterioration.  Critical care was time spent personally by me on the following activities: development of treatment plan with patient and/or surrogate as well as nursing, discussions with consultants, evaluation of patient's response to treatment, examination of patient, obtaining history from patient or surrogate, ordering and performing treatments and interventions, ordering and review of laboratory studies, ordering and review of radiographic studies, pulse oximetry and re-evaluation of patient's  condition.   Amount and/or Complexity of Data Reviewed Labs: ordered. Decision-making details documented in ED Course. Radiology: ordered. Decision-making details documented in ED Course. ECG/medicine tests: ordered and independent interpretation performed. Decision-making details documented in ED Course.  Risk Prescription drug management. Decision regarding hospitalization.         Drenda Freeze, MD 10/27/22 1728

## 2022-10-28 ENCOUNTER — Encounter (HOSPITAL_COMMUNITY): Payer: Self-pay | Admitting: Family Medicine

## 2022-10-28 ENCOUNTER — Inpatient Hospital Stay (HOSPITAL_COMMUNITY): Payer: Medicare Other

## 2022-10-28 DIAGNOSIS — F03B0 Unspecified dementia, moderate, without behavioral disturbance, psychotic disturbance, mood disturbance, and anxiety: Secondary | ICD-10-CM

## 2022-10-28 DIAGNOSIS — I629 Nontraumatic intracranial hemorrhage, unspecified: Secondary | ICD-10-CM

## 2022-10-28 DIAGNOSIS — W19XXXA Unspecified fall, initial encounter: Secondary | ICD-10-CM

## 2022-10-28 DIAGNOSIS — M6281 Muscle weakness (generalized): Secondary | ICD-10-CM

## 2022-10-28 DIAGNOSIS — I6389 Other cerebral infarction: Secondary | ICD-10-CM | POA: Diagnosis not present

## 2022-10-28 DIAGNOSIS — G9389 Other specified disorders of brain: Secondary | ICD-10-CM

## 2022-10-28 DIAGNOSIS — I609 Nontraumatic subarachnoid hemorrhage, unspecified: Secondary | ICD-10-CM | POA: Diagnosis not present

## 2022-10-28 DIAGNOSIS — G40909 Epilepsy, unspecified, not intractable, without status epilepticus: Secondary | ICD-10-CM | POA: Diagnosis not present

## 2022-10-28 DIAGNOSIS — R269 Unspecified abnormalities of gait and mobility: Secondary | ICD-10-CM

## 2022-10-28 LAB — ECHOCARDIOGRAM COMPLETE
Area-P 1/2: 1.84 cm2
Height: 72 in
MV VTI: 1.92 cm2
S' Lateral: 2.9 cm
Weight: 4765.46 oz

## 2022-10-28 LAB — CBC
HCT: 35.5 % — ABNORMAL LOW (ref 39.0–52.0)
Hemoglobin: 11.9 g/dL — ABNORMAL LOW (ref 13.0–17.0)
MCH: 31.5 pg (ref 26.0–34.0)
MCHC: 33.5 g/dL (ref 30.0–36.0)
MCV: 93.9 fL (ref 80.0–100.0)
Platelets: 189 10*3/uL (ref 150–400)
RBC: 3.78 MIL/uL — ABNORMAL LOW (ref 4.22–5.81)
RDW: 13.8 % (ref 11.5–15.5)
WBC: 5.3 10*3/uL (ref 4.0–10.5)
nRBC: 0 % (ref 0.0–0.2)

## 2022-10-28 LAB — BASIC METABOLIC PANEL
Anion gap: 8 (ref 5–15)
BUN: 13 mg/dL (ref 8–23)
CO2: 24 mmol/L (ref 22–32)
Calcium: 8.7 mg/dL — ABNORMAL LOW (ref 8.9–10.3)
Chloride: 105 mmol/L (ref 98–111)
Creatinine, Ser: 1.36 mg/dL — ABNORMAL HIGH (ref 0.61–1.24)
GFR, Estimated: 49 mL/min — ABNORMAL LOW (ref >60)
Glucose, Bld: 137 mg/dL — ABNORMAL HIGH (ref 70–99)
Potassium: 3.8 mmol/L (ref 3.5–5.1)
Sodium: 137 mmol/L (ref 135–145)

## 2022-10-28 LAB — LIPID PANEL
Cholesterol: 191 mg/dL (ref 0–200)
HDL: 45 mg/dL (ref >40)
LDL Cholesterol: 131 mg/dL — ABNORMAL HIGH (ref 0–99)
Total CHOL/HDL Ratio: 4.2 RATIO
Triglycerides: 77 mg/dL (ref <150)
VLDL: 15 mg/dL (ref 0–40)

## 2022-10-28 LAB — GLUCOSE, CAPILLARY
Glucose-Capillary: 106 mg/dL — ABNORMAL HIGH (ref 70–99)
Glucose-Capillary: 132 mg/dL — ABNORMAL HIGH (ref 70–99)
Glucose-Capillary: 140 mg/dL — ABNORMAL HIGH (ref 70–99)

## 2022-10-28 LAB — HEMOGLOBIN A1C
Hgb A1c MFr Bld: 6.3 % — ABNORMAL HIGH (ref 4.8–5.6)
Mean Plasma Glucose: 134.11 mg/dL

## 2022-10-28 MED ORDER — PERFLUTREN LIPID MICROSPHERE
1.0000 mL | INTRAVENOUS | Status: AC | PRN
  Administered 2022-10-28: 3 mL via INTRAVENOUS

## 2022-10-28 MED ORDER — ORAL CARE MOUTH RINSE: 15.0000 mL | OROMUCOSAL | Status: DC | PRN

## 2022-10-28 NOTE — Plan of Care (Signed)
  Problem: Education: Goal: Knowledge of General Education information will improve Description: Including pain rating scale, medication(s)/side effects and non-pharmacologic comfort measures Outcome: Progressing   Problem: Health Behavior/Discharge Planning: Goal: Ability to manage health-related needs will improve Outcome: Progressing   Problem: Clinical Measurements: Goal: Ability to maintain clinical measurements within normal limits will improve Outcome: Progressing Goal: Will remain free from infection Outcome: Progressing Goal: Diagnostic test results will improve Outcome: Progressing Goal: Respiratory complications will improve Outcome: Progressing Goal: Cardiovascular complication will be avoided Outcome: Progressing   Problem: Activity: Goal: Risk for activity intolerance will decrease Outcome: Progressing   Problem: Nutrition: Goal: Adequate nutrition will be maintained Outcome: Progressing   Problem: Coping: Goal: Level of anxiety will decrease Outcome: Progressing   Problem: Elimination: Goal: Will not experience complications related to bowel motility Outcome: Progressing Goal: Will not experience complications related to urinary retention Outcome: Progressing   Problem: Pain Managment: Goal: General experience of comfort will improve Outcome: Progressing   Problem: Safety: Goal: Ability to remain free from injury will improve Outcome: Progressing   Problem: Skin Integrity: Goal: Risk for impaired skin integrity will decrease Outcome: Progressing   Problem: Education: Goal: Knowledge of disease or condition will improve Outcome: Progressing Goal: Knowledge of secondary prevention will improve (MUST DOCUMENT ALL) Outcome: Progressing Goal: Knowledge of patient specific risk factors will improve Elta [place] N/A or DELETE if not current risk factor) Outcome: Progressing   Problem: Spontaneous Subarachnoid Hemorrhage Tissue Perfusion: Goal:  Complications of Spontaneous Subarachnoid Hemorrhage will be minimized Outcome: Progressing   Problem: Coping: Goal: Will verbalize positive feelings about self Outcome: Progressing Goal: Will identify appropriate support needs Outcome: Progressing   Problem: Health Behavior/Discharge Planning: Goal: Ability to manage health-related needs will improve Outcome: Progressing Goal: Goals will be collaboratively established with patient/family Outcome: Progressing   Problem: Self-Care: Goal: Ability to participate in self-care as condition permits will improve Outcome: Progressing Goal: Verbalization of feelings and concerns over difficulty with self-care will improve Outcome: Progressing Goal: Ability to communicate needs accurately will improve Outcome: Progressing   Problem: Nutrition: Goal: Risk of aspiration will decrease Outcome: Progressing Goal: Dietary intake will improve Outcome: Progressing

## 2022-10-28 NOTE — Assessment & Plan Note (Deleted)
F/u BMP

## 2022-10-28 NOTE — Progress Notes (Signed)
Patient transfer to 4N progressive,report given to nurse ,[place].

## 2022-10-28 NOTE — Plan of Care (Incomplete)
No family at the bedside, pt lying in bed, mildly lethargic, but able to open eyes on voice, orientated to self but not to age, place, time. Paucity of speech and not coherent but following all simple commands although hard of hearing. Able to name and repeat simple sentences. No gaze palsy, tracking bilaterally, blinking to visual threat bilaterally. No significant facial droop. Tongue midline. Bilateral UEs 4/5, no drift. LLE barely 3/5 but RLE 2/5 likely due to positioning, BLE distal ankle DF/PF 4/5. Sensation, coordination not cooperative and gait not tested.

## 2022-10-28 NOTE — Progress Notes (Signed)
Patient transfer to  have a stat CT. Patient transfer by MD and Leighton Ruff

## 2022-10-28 NOTE — Evaluation (Signed)
Physical Therapy Evaluation Patient Details Name: Samuel Carr MRN: [id number] DOB: 1930/01/07 Today's Date: 10/28/2022  History of Present Illness  Pt is a 89 y.o. male admitted 1/19 after a fall and found to have SAH, L frontal hematoma.  PMH:  Afib (not on antibcoag), dementia, T2DM, HLD, HTN, seizure, prior [facility]   Clinical Impression  Pt admitted with above diagnosis. PTA pt lived at home with his daughter and SIL, amb with RW but multi falls. Pt currently with functional limitations due to the deficits listed below (see PT Problem List). On eval, pt required max assist bed mobility, and demonstrated poor sitting balance EOB. Pt very stiff, and presenting with heavy posterior lean in sitting. Dementia at baseline and very HOH. Pt will benefit from skilled PT to increase their independence and safety with mobility to allow discharge to the venue listed below.          Recommendations for follow up therapy are one component of a multi-disciplinary discharge planning process, led by the attending physician.  Recommendations may be updated based on patient status, additional functional criteria and insurance authorization.  Follow Up Recommendations Skilled nursing-short term rehab (<3 hours/day) Can patient physically be transported by private vehicle: No    Assistance Recommended at Discharge Frequent or constant Supervision/Assistance  Patient can return home with the following  Two people to help with walking and/or transfers;Two people to help with bathing/dressing/bathroom;Assistance with cooking/housework;Assist for transportation    Equipment Recommendations Wheelchair (measurements PT);Wheelchair cushion (measurements PT)  Recommendations for Other Services       Functional Status Assessment Patient has had a recent decline in their functional status and demonstrates the ability to make significant improvements in function in a reasonable and predictable amount of time.      Precautions / Restrictions Precautions Precautions: Fall      Mobility  Bed Mobility Overal bed mobility: Needs Assistance Bed Mobility: Supine to Sit, Sit to Supine     Supine to sit: Max assist, HOB elevated Sit to supine: Max assist   General bed mobility comments: assist with BLE and trunk. Heavy posterior lean    Transfers                   General transfer comment: unable to safely progress beyond EOB with +1 assist    Ambulation/Gait               General Gait Details: unable  Stairs            Wheelchair Mobility    Modified Rankin (Stroke Patients Only)       Balance Overall balance assessment: Needs assistance Sitting-balance support: Feet supported, No upper extremity supported Sitting balance-Leahy Scale: Poor   Postural control: Posterior lean                                   Pertinent Vitals/Pain Pain Assessment Pain Assessment: Faces Faces Pain Scale: No hurt    Home Living Family/patient expects to be discharged to:: Private residence Living Arrangements: Children Available Help at Discharge: Available 24 hours/day Type of Home: House           Home Equipment: [place] (2 wheels) Additional Comments: Pt is a poor historain. No family present. Home set up and PLOF taken from chart.    Prior Function Prior Level of Function : Patient poor historian/Family not available  Mobility Comments: Per chart, pt amb with AD. Multiple falls.       Hand Dominance   Dominant Hand: Right    Extremity/Trunk Assessment   Upper Extremity Assessment Upper Extremity Assessment: Defer to OT evaluation    Lower Extremity Assessment Lower Extremity Assessment: Generalized weakness    Cervical / Trunk Assessment Cervical / Trunk Assessment: Kyphotic  Communication   Communication: HOH  Cognition Arousal/Alertness: Awake/alert Behavior During Therapy: Flat affect Overall  Cognitive Status: No family/caregiver present to determine baseline cognitive functioning                                 General Comments: dementia at baseline. Very HOH. Following simple commands when therapist speaking loudly.        General Comments General comments (skin integrity, edema, etc.): Unable to stand for full orthostatic vitals. BP supine 122/64. BP sitting 123/61    Exercises     Assessment/Plan    PT Assessment Patient needs continued PT services  PT Problem List Decreased strength;Decreased balance;Decreased cognition;Decreased knowledge of precautions;Decreased mobility;Decreased activity tolerance;Decreased safety awareness       PT Treatment Interventions DME instruction;Functional mobility training;Balance training;Patient/family education;Gait training;Therapeutic activities;Therapeutic exercise;Cognitive remediation    PT Goals (Current goals can be found in the Care Plan section)  Acute Rehab PT Goals Patient Stated Goal: unable to state PT Goal Formulation: Patient unable to participate in goal setting Time For Goal Achievement: 11/11/22 Potential to Achieve Goals: Fair    Frequency Min 2X/week     Co-evaluation               AM-PAC PT "6 Clicks" Mobility  Outcome Measure Help needed turning from your back to your side while in a flat bed without using bedrails?: A Lot Help needed moving from lying on your back to sitting on the side of a flat bed without using bedrails?: Total Help needed moving to and from a bed to a chair (including a wheelchair)?: Total Help needed standing up from a chair using your arms (e.g., wheelchair or bedside chair)?: Total Help needed to walk in hospital room?: Total Help needed climbing 3-5 steps with a railing? : Total 6 Click Score: 7    End of Session   Activity Tolerance: Patient tolerated treatment well Patient left: in bed;with call bell/phone within reach;with bed alarm set Nurse  Communication: Mobility status PT Visit Diagnosis: Other abnormalities of gait and mobility (R26.89);Muscle weakness (generalized) (M62.81);Repeated falls (R29.6)    Time: 1610-9604 PT Time Calculation (min) (ACUTE ONLY): 16 min   Charges:   PT Evaluation $PT Eval Moderate Complexity: 1 Mod          Lorrin Goodell, PT  Office # [phone] Pager [phone]   Lorriane Shire 10/28/2022, 12:43 PM

## 2022-10-28 NOTE — Progress Notes (Addendum)
STROKE TEAM PROGRESS NOTE   INTERVAL HISTORY Patient is seen in his room with no family at the bedside.  Yesterday, he had an unwitnessed fall and was found to have a left frontal cortical hematoma with overlying subarachnoid hemorrhage.  His blood pressure was under good control, and he did not require ICU care, so he was admitted to the progressive unit.  Vitals:   10/28/22 0005 10/28/22 0045 10/28/22 0400 10/28/22 0845  BP: 117/61 (!) 140/74 123/62 (!) 141/63  Pulse: 70 81 60 64  Resp: 17 20 12 17   Temp: (!) 97 F (36.1 C) 98.8 F (37.1 C) 98.9 F (37.2 C) 98.4 F (36.9 C)  TempSrc: Oral Oral Axillary Axillary  SpO2: 99% 99% 99% 98%  Weight:      Height:       CBC:  Recent Labs  Lab 10/26/22 2358  WBC 5.6  HGB 12.6*  HCT 37.6*  MCV 97.7  PLT 195   Basic Metabolic Panel:  Recent Labs  Lab 10/26/22 2358  NA 138  K 3.9  CL 107  CO2 21*  GLUCOSE 141*  BUN 18  CREATININE 1.26*  CALCIUM 9.0   Lipid Panel: No results for input(s): "CHOL", "TRIG", "HDL", "CHOLHDL", "VLDL", "LDLCALC" in the last 168 hours. HgbA1c: No results for input(s): "HGBA1C" in the last 168 hours. Urine Drug Screen: No results for input(s): "LABOPIA", "COCAINSCRNUR", "LABBENZ", "AMPHETMU", "THCU", "LABBARB" in the last 168 hours.  Alcohol Level No results for input(s): "ETH" in the last 168 hours.  IMAGING past 24 hours CT HEAD WO CONTRAST (10/28/22)  Result Date: 10/27/2022 CLINICAL DATA:  Magic stroke. EXAM: CT HEAD WITHOUT CONTRAST TECHNIQUE: Contiguous axial images were obtained from the base of the skull through the vertex without intravenous contrast. RADIATION DOSE REDUCTION: This exam was performed according to the departmental dose-optimization program which includes automated exposure control, adjustment of the mA and/or kV according to patient size and/or use of iterative reconstruction technique. COMPARISON:  Earlier CT dated 10/27/2022. FINDINGS: Evaluation of this exam is limited due to  motion artifact. Brain: Moderate age-related atrophy and chronic microvascular ischemic changes. Left posterior frontal convexity hemorrhage measures approximately 2.7 x 3.0 cm relatively similar to prior CT. There is mild surrounding edema. Small areas of subdural hemorrhage or petechial hemorrhage involving the right posterior frontal and parietal convexity, similar to prior CT. No midline shift. Vascular: No acute findings. Skull: No acute findings. Sinuses/Orbits: No acute finding. Other: None IMPRESSION: 1. No significant interval change in the left posterior frontal convexity hemorrhage. 2. Small areas of subdural hemorrhage or petechial hemorrhage involving the right posterior frontal and parietal convexity, similar to prior CT. 3. Moderate age-related atrophy and chronic microvascular ischemic changes. Electronically Signed   By: 10/29/2022 M.D.   On: 10/27/2022 20:53   MR BRAIN WO CONTRAST  Result Date: 10/27/2022 CLINICAL DATA:  Intracranial hemorrhage follow up EXAM: MRI HEAD WITHOUT CONTRAST TECHNIQUE: Multiplanar, multiecho pulse sequences of the brain and surrounding structures were obtained without intravenous contrast. COMPARISON:  10/27/2022 head CT FINDINGS: Brain: Unchanged size of left frontoparietal intraparenchymal hematoma with overlying subarachnoid hemorrhage. 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 There are new areas of extra-axial hyperintensity on FLAIR imaging at the right occipital and posterior right parietal lobes, which may indicate new sites of hemorrhage. There is confluent hyperintense T2-weighted signal within the periventricular and deep white matter. Bifrontal encephalomalacia. There is generalized volume loss. The midline structures are normal. There are a few predominantly  central chronic microhemorrhages in a pattern most suggestive of chronic hypertensive angiopathy. No midline shift or other mass effect. Vascular: Normal flow voids.  Skull and upper cervical spine: Normal bone marrow signal. Sinuses/Orbits: Negative. Other: None. IMPRESSION: 1. New areas of extra-axial hyperintensity on FLAIR imaging at the right occipital and posterior right parietal lobes, which may indicate new sites of hemorrhage. Head CT correlation recommended. 2. Unchanged size of left frontoparietal intraparenchymal hematoma with overlying subarachnoid hemorrhage. 3. 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 4. 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 These results were called by telephone at the time of interpretation on 10/27/2022 at 8:10 pm to provider DAVID YAO , who verbally acknowledged these results. Electronically Signed   By: Deatra Robinson M.D.   On: 10/27/2022 20:10   CT ANGIO HEAD NECK W WO CM  Result Date: 10/27/2022 CLINICAL DATA:  Cerebral aneurysm screening, high-risk EXAM: CT ANGIOGRAPHY HEAD AND NECK TECHNIQUE: Multidetector CT imaging of the head and neck was performed using the standard protocol during bolus administration of intravenous contrast. Multiplanar CT image reconstructions and MIPs were obtained to evaluate the vascular anatomy. Carotid stenosis measurements (when applicable) are obtained utilizing NASCET criteria, using the distal internal carotid diameter as the denominator. RADIATION DOSE REDUCTION: This exam was performed according to the departmental dose-optimization program which includes automated exposure control, adjustment of the mA and/or kV according to patient size and/or use of iterative reconstruction technique. CONTRAST:  88mL OMNIPAQUE IOHEXOL 350 MG/ML SOLN COMPARISON:  CT head the same day FINDINGS: CTA NECK FINDINGS Aortic arch: Great vessel origins are patent. Right carotid system: Atherosclerosis at the carotid bifurcation without greater than 50% stenosis. Left carotid system: Atherosclerosis at the carotid bifurcation without greater than 50% stenosis.  Vertebral arteries: Right dominant. Both vertebral arteries are patent without significant (greater than 50%) stenosis. Skeleton: Severe multilevel degenerative change including degenerative disc disease, facet and uncovertebral hypertrophy with varying degrees of neural foraminal stenosis Other neck: No acute findings on limited assessment. Upper chest: Visualized lung apices are clear. Review of the MIP images confirms the above findings CTA HEAD FINDINGS Anterior circulation: Bilateral intracranial ICAs are patent with mild for age narrowing due to calcific atherosclerosis. Bilateral MCAs and ACAs are patent without proximal hemodynamically significant stenosis. No visible aneurysm or arteriovenous malformation. Blood products in the region of acute hemorrhage does limit assessment. Posterior circulation: Bilateral intradural vertebral arteries, basilar artery and bilateral posterior cerebral arteries are patent without proximal hemodynamically significant stenosis. No aneurysm identified. Venous sinuses: As permitted by contrast timing, patent. review of the MIP images confirms the above findings IMPRESSION: 1. No large vessel occlusion or proximal hemodynamically significant stenosis 2. No visible aneurysm arteriovenous malformation, although acute blood products does limit assessment. Electronically Signed   By: Feliberto Harts M.D.   On: 10/27/2022 16:56    PHYSICAL EXAM General: Alert, well-developed, well-nourished elderly patient in no acute distress Respiratory: Regular, unlabored respirations on room air Cardiovascular: Atrial fibrillation seen on monitor  NEURO:  Mental Status: AA&Ox1, able to follow one-step but not two-step commands Speech/Language: speech is with some dysarthria, patient noted to be very hard of hearing  Cranial Nerves:  II: PERRL. III, IV, VI: EOMI. Eyelids elevate symmetrically.  V: Sensation is intact to light touch and symmetrical to face.  VII: Smile is  symmetrical.   VIII: hearing intact to voice. IX, X: Voice is somewhat dysarthric PJ:[id number] shrug 5/5. XII: tongue is midline without fasciculations. Motor: 5/5 strength  to all muscle groups tested.  Tone: is normal and bulk is normal Sensation- Intact to light touch bilaterally.  Coordination: Subtle drift in right upper extremity Gait- deferred   ASSESSMENT/PLAN Mr. Samuel Carr is a 89 y.o. male with history of ICH, seizures, diabetes, dementia, atrial fibrillation not on anticoagulation and hyperlipidemia presenting after he had an unwitnessed fall and was found to have a left frontal cortical hematoma with overlying subarachnoid hemorrhage.  His blood pressure was under good control, and he did not require ICU care, so he was admitted to the progressive unit.  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  CT head IPH in left superior frontal lobe with overlying subarachnoid hemorrhage CTA head & neck no LVO or hemodynamically significant stenosis Follow-up CT 1/20 0917 no progression of left IPH Follow-up CT 1/20 at 8 PM no significant change in left posterior frontal IPH, Small areas of subdural hemorrhage or petechial hemorrhage involving the right posterior frontal and parietal convexity MRI new areas of extra-axial hyperintensity on FLAIR right occipital and posterior right parietal lobes, unchanged left frontoparietal IPH with overlying SAH, chronic hypertensive angiopathy and remote TBI 2D Echo EF 60-65% LDL 131 HgbA1c 6.3 VTE prophylaxis -SCDs No antithrombotic prior to admission, now on No antithrombotic secondary to IPH Therapy recommendations: SNF Disposition: Pending  Seizure disorder Patient developed a seizure disorder after his prior  ICH No seizure activity seen this admission Continue Keppra 500 mg twice daily  PAF Not on AC due to previous ICH  No antithrombotics now given concern of CAA  Hypertension Home meds: Lisinopril 5 mg daily, amlodipine 5 mg daily, continued in hospital Stable Keep systolic blood pressure less than 140 given possible CAA Long-term BP goal normotensive  Hyperlipidemia Home meds: Atorvastatin 80 mg daily LDL 131, goal < 70 Recommend against statin at this time with concern  of CAA No SATURN trial candidate due to dementia.  Diabetes type II Controlled Home meds: Metformin 500 mg twice daily HgbA1c 6.1, goal < 7.0 CBGs SSI  Other Stroke Risk Factors Advanced Age >/= 44  Obesity, Body mass index is 40.39 kg/m., BMI >/= 30 associated with increased stroke risk, recommend weight loss, diet and exercise as appropriate   Other Active Problems Dementia-continue home Seroquel  Hospital day # [place] , MSN, AGACNP-BC [facility] See Amion for schedule and pager information 10/28/2022 1:28 PM   ATTENDING NOTE: I reviewed above note and agree with the assessment and plan. Pt was seen and examined.   89 year old male with history of A-fib not on AC, hypertension, hyperlipidemia, diabetes, dementia, history of ICH and SAH admitted for unwitnessed fall at home.  CT showed left frontal ICH and SAH.  Repeat CT stable hematoma.  MRI showed stable left frontal ICH but also likely right parietal and occipital late subacute SAH/SDH, along with numerous microbleed and significant bilateral cortical hemosiderosis.  CT head and neck unremarkable.  EF 60 to 65%, LDL 131, A1c 6.3, creatinine 1.26.  In 09/2020 MRI showed multiple cerebral microbleeds, CAA cannot be excluded.  In 05/2021 patient admitted for left leg weakness but also found to have left leg jerking shaking spreading to left arm, treated with Keppra.  CT showed had right parietal ICH and SAH.  Patient also  found to  have A-fib.  On exam, no family at the bedside, pt lying in bed, mildly lethargic, but able to open eyes on voice, orientated to self but not to age, place, time. Paucity of speech and not coherent but following all simple commands although hard of hearing. Able to name and repeat simple sentences. No gaze palsy, tracking bilaterally, blinking to visual threat bilaterally. No significant facial droop. Tongue midline. Bilateral UEs 4/5, no drift. LLE barely 3/5 but RLE 2/5 likely due to positioning, BLE distal ankle DF/PF 4/5. Sensation, coordination not cooperative and gait not tested.   Etiology of patient recurrent ICH/SAH concerning for CAA in the setting of significant CMBs and hemosiderosis on MRI although posttraumatic hemorrhage can be part of the picture too.  No antithrombotic for now given ICH and possible CAA.  Also recommend against statin given possible CAA.  BP management, BP goal less than 140.  PT therapy recommend SNF.  Continue Keppra for seizure control.  Will follow.  For detailed assessment and plan, please refer to above/below as I have made changes wherever appropriate.   Rosalin Hawking, MD PhD Stroke Neurology 10/28/2022 11:16 PM   To contact Stroke Continuity provider, please refer to [email or web address] After hours, contact General Neurology

## 2022-10-28 NOTE — Progress Notes (Signed)
*  PRELIMINARY RESULTS* Echocardiogram 2D Echocardiogram has been performed with Definity.  Samuel Germany 10/28/2022, 1:16 PM

## 2022-10-28 NOTE — Progress Notes (Signed)
     Daily Progress Note Intern Pager: [phone]  Patient name: Samuel Carr Medical record number: [id number] Date of birth: Apr 02, 1930 Age: 89 y.o. Gender: male  Primary Care Provider: Loura Pardon, MD Consultants: Neurology Code Status: Full  Pt Overview and Major Events to Date:  1/20 Admitted  Assessment and Plan: AO is a 89 yo M admitted after a fall and found to have [place], L frontal hematoma.  PMHx pertinent for Afib (not on antibcoag), dementia, T2DM, HLD, HTN, seizure, prior SAH  * Subarachnoid hemorrhage ([place]) - f/u PT/OT recs - Neuro consulted, appreciate recs - Goal SBP <150 - Fall precautions  Hypertension Goal SBP <150. -Continue home metoprolol, amlodipine -IV hydralazine as needed to maintain goal  Seizure disorder ([place]) -Continue home Keppra 500 mg twice daily -Seizure precautions  Fall -Fall precautions -orthostatic vitals -Cardiac monitoring -PT/OT  Dementia ([place]) -Delirium precautions -Continue home Seroquel for sleep  AKI (acute kidney injury) ([place]) F/u BMP   Diabetes mellitus without complication ([place]) - CBGs w/ meals and bedtime - add SSI if indicated   FEN/GI: regular PPx: SCDs Dispo:Pending PT recommendations  pending clinical improvement . Barriers include monitoring of neuro status and potential need for IV BP medications.   Subjective:  Denies vision changes. Feels like he fine. NAEO.  Objective: Temp:  [97 F (36.1 C)-98.9 F (37.2 C)] 98.9 F (37.2 C) (01/21 0400) Pulse Rate:  [56-83] 64 (01/21 0845) Resp:  [11-20] 17 (01/21 0845) BP: (117-171)/(61-95) 141/63 (01/21 0845) SpO2:  [98 %-100 %] 98 % (01/21 0845) Physical Exam: General: Sleeping, but easily awakens to voice. Is hard of hearing. Laying comfortably in bed. NAD. Neuro: Alert and grossly oriented Cardiovascular: RRR Respiratory: CTAB. Normal WOB on RA. Abdomen: Soft, nontender, nondistended. Normal BS.  Laboratory: Most recent CBC Lab Results   Component Value Date   WBC 5.6 10/26/2022   HGB 12.6 (L) 10/26/2022   HCT 37.6 (L) 10/26/2022   MCV 97.7 10/26/2022   PLT 195 10/26/2022   Most recent BMP    Latest Ref Rng & Units 10/26/2022   11:58 PM  BMP  Glucose 70 - 99 mg/dL 141   BUN 8 - 23 mg/dL 18   Creatinine 0.61 - 1.24 mg/dL 1.26   Sodium 135 - 145 mmol/L 138   Potassium 3.5 - 5.1 mmol/L 3.9   Chloride 98 - 111 mmol/L 107   CO2 22 - 32 mmol/L 21   Calcium 8.9 - 10.3 mg/dL 9.0     Arlyce Dice, MD 10/28/2022, 11:58 AM  PGY-1, [place] Intern pager: [phone], text pages welcome Secure chat group [place]

## 2022-10-29 DIAGNOSIS — W19XXXA Unspecified fall, initial encounter: Secondary | ICD-10-CM | POA: Diagnosis not present

## 2022-10-29 DIAGNOSIS — I629 Nontraumatic intracranial hemorrhage, unspecified: Secondary | ICD-10-CM | POA: Diagnosis not present

## 2022-10-29 DIAGNOSIS — F03B0 Unspecified dementia, moderate, without behavioral disturbance, psychotic disturbance, mood disturbance, and anxiety: Secondary | ICD-10-CM | POA: Diagnosis not present

## 2022-10-29 DIAGNOSIS — I609 Nontraumatic subarachnoid hemorrhage, unspecified: Secondary | ICD-10-CM | POA: Diagnosis not present

## 2022-10-29 LAB — BASIC METABOLIC PANEL
Anion gap: 9 (ref 5–15)
BUN: 24 mg/dL — ABNORMAL HIGH (ref 8–23)
CO2: 21 mmol/L — ABNORMAL LOW (ref 22–32)
Calcium: 8.3 mg/dL — ABNORMAL LOW (ref 8.9–10.3)
Chloride: 107 mmol/L (ref 98–111)
Creatinine, Ser: 1.43 mg/dL — ABNORMAL HIGH (ref 0.61–1.24)
GFR, Estimated: 46 mL/min — ABNORMAL LOW (ref >60)
Glucose, Bld: 119 mg/dL — ABNORMAL HIGH (ref 70–99)
Potassium: 3.9 mmol/L (ref 3.5–5.1)
Sodium: 137 mmol/L (ref 135–145)

## 2022-10-29 LAB — CBC
HCT: 32.8 % — ABNORMAL LOW (ref 39.0–52.0)
Hemoglobin: 11.4 g/dL — ABNORMAL LOW (ref 13.0–17.0)
MCH: 32.9 pg (ref 26.0–34.0)
MCHC: 34.8 g/dL (ref 30.0–36.0)
MCV: 94.5 fL (ref 80.0–100.0)
Platelets: 172 10*3/uL (ref 150–400)
RBC: 3.47 MIL/uL — ABNORMAL LOW (ref 4.22–5.81)
RDW: 13.8 % (ref 11.5–15.5)
WBC: 4.7 10*3/uL (ref 4.0–10.5)
nRBC: 0 % (ref 0.0–0.2)

## 2022-10-29 LAB — GLUCOSE, CAPILLARY
Glucose-Capillary: 93 mg/dL (ref 70–99)
Glucose-Capillary: 138 mg/dL — ABNORMAL HIGH (ref 70–99)
Glucose-Capillary: 97 mg/dL (ref 70–99)
Glucose-Capillary: 107 mg/dL — ABNORMAL HIGH (ref 70–99)

## 2022-10-29 MED ORDER — QUETIAPINE FUMARATE 50 MG PO TABS
25.0000 mg | ORAL_TABLET | Freq: Once | ORAL | Status: AC | PRN
  Administered 2022-10-30: 25 mg via ORAL
  Filled 2022-10-29: qty 1

## 2022-10-29 NOTE — Evaluation (Addendum)
Occupational Therapy Evaluation Patient Details Name: Samuel Carr MRN: [id number] DOB: [birth year] Today's Date: 10/29/2022   History of Present Illness Pt is a 89 y.o. male admitted 1/19 after a fall and found to have SAH, L frontal hematoma.  PMH:  Afib (not on antibcoag), dementia, T2DM, HLD, HTN, seizure, prior [facility]   Clinical Impression   Pt currently max assist for sit to stand and transfers as well as mod +2 for simulated selfcare sit to stand.  Decreased hearing noted at baseline, so difficult to get PLOF information.  Right UE hemiparesis and right inattention noted as he would leave his RUE slightly behind him with wrist flexed and back of hand on the bed without any awareness when scooting out to the edge.   Feel he will benefit from acute care OT at this time with transition to SNF for continued rehab post acute stay.       Recommendations for follow up therapy are one component of a multi-disciplinary discharge planning process, led by the attending physician.  Recommendations may be updated based on patient status, additional functional criteria and insurance authorization.   Follow Up Recommendations  Skilled nursing-short term rehab (<3 hours/day)     Assistance Recommended at Discharge Frequent or constant Supervision/Assistance  Patient can return home with the following A little help with walking and/or transfers;A little help with bathing/dressing/bathroom;Assistance with cooking/housework;Assist for transportation;Help with stairs or ramp for entrance;Direct supervision/assist for medications management    Functional Status Assessment  Patient has had a recent decline in their functional status and demonstrates the ability to make significant improvements in function in a reasonable and predictable amount of time.  Equipment Recommendations  Other (comment) (TBD next venue of care)       Precautions / Restrictions Precautions Precautions: Fall      Mobility  Bed Mobility Overal bed mobility: Needs Assistance Bed Mobility: Supine to Sit, Sit to Supine     Supine to sit: Max assist, HOB elevated     General bed mobility comments: Assist with all aspects ofr transfer to the EOB.    Transfers Overall transfer level: Needs assistance   Transfers: Sit to/from Stand, Bed to chair/wheelchair/BSC Sit to Stand: Max assist   Squat pivot transfers: Max assist              Balance Overall balance assessment: Needs assistance Sitting-balance support: Feet supported, No upper extremity supported Sitting balance-Leahy Scale: Poor Sitting balance - Comments: posterior lean and LOB     Standing balance-Leahy Scale: Zero Standing balance comment: Max assist from therapist to maintain standing.                           ADL either performed or assessed with clinical judgement   ADL Overall ADL's : Needs assistance/impaired Eating/Feeding: Minimal assistance;Sitting Eating/Feeding Details (indicate cue type and reason): simulated Grooming: Wash/dry hands;Wash/dry face;Sitting;Minimal assistance Grooming Details (indicate cue type and reason): simulated EOB Upper Body Bathing: Moderate assistance;Sitting Upper Body Bathing Details (indicate cue type and reason): simulated EOB Lower Body Bathing: Moderate assistance;+2 for physical assistance;Sit to/from stand Lower Body Bathing Details (indicate cue type and reason): simulated Upper Body Dressing : Moderate assistance;Sitting Upper Body Dressing Details (indicate cue type and reason): simulated Lower Body Dressing: Moderate assistance;+2 for physical assistance Lower Body Dressing Details (indicate cue type and reason): simulated Toilet Transfer: Maximal assistance;BSC/3in1;Stand-pivot Toilet Transfer Details (indicate cue type and reason): simulated Toileting- Clothing Manipulation and Hygiene: +2  for physical assistance;Moderate assistance       Functional mobility during  ADLs: Maximal assistance (stand pivot to bedside recliner) General ADL Comments: Pt currently max assist for stand pivot transfer.  Decreased abiliity to take adequate steps for transfer.  Pt very HOH so difficult to get accurate ADL function prior to admission.     Vision Baseline Vision/History: 1 Wears glasses Ability to See in Adequate Light: 0 Adequate Patient Visual Report: No change from baseline Vision Assessment?: Yes Eye Alignment: Within Functional Limits Ocular Range of Motion: Within Functional Limits Alignment/Gaze Preference: Within Defined Limits Tracking/Visual Pursuits: Decreased smoothness of horizontal tracking;Decreased smoothness of vertical tracking Visual Fields:  (not formally assessed but pt able to state correct number of fingers in each visual field.)     Perception  See eval for details       Pertinent Vitals/Pain Pain Assessment Pain Assessment: Faces Pain Score: 0-No pain     Hand Dominance Right   Extremity/Trunk Assessment Upper Extremity Assessment Upper Extremity Assessment: RUE deficits/detail RUE Deficits / Details: Strength 4/5 througout.  Dysmetria noted with finger to nose testing.  Decreased FM and gross motor coordination. RUE Sensation: decreased light touch;decreased proprioception (Pt leaving right arm slightly behind him when transferring to the EOB and also letting it hang off of the chair.) RUE Coordination: decreased fine motor;decreased gross motor   Lower Extremity Assessment Lower Extremity Assessment: Defer to PT evaluation   Cervical / Trunk Assessment Cervical / Trunk Assessment: Kyphotic   Communication Communication Communication: HOH   Cognition Arousal/Alertness: Awake/alert Behavior During Therapy: Flat affect Overall Cognitive Status: No family/caregiver present to determine baseline cognitive functioning                                 General Comments: dementia at baseline. Very HOH. Following  simple commands when therapist speaking loudly but unable to get reliable PLOF information except that he stated he used a cane at home.                Home Living Family/patient expects to be discharged to:: Private residence Living Arrangements: Children Available Help at Discharge: Available 24 hours/day Type of Home: House                       Home Equipment: Cane - Programmer, applications (2 wheels)   Additional Comments: Pt isseverely hard of hearing and no family present. Home set up and PLOF taken from chart.      Prior Functioning/Environment Prior Level of Function : Patient poor historian/Family not available             Mobility Comments: Per chart, pt amb with AD. Multiple falls.          OT Problem List: Decreased strength;Decreased activity tolerance;Impaired balance (sitting and/or standing);Decreased safety awareness;Decreased cognition;Impaired UE functional use;Decreased coordination;Decreased knowledge of use of DME or AE      OT Treatment/Interventions: Self-care/ADL training;DME and/or AE instruction;Balance training;Therapeutic activities;Patient/family education;Therapeutic exercise;Neuromuscular education;Cognitive remediation/compensation    OT Goals(Current goals can be found in the care plan section) Acute Rehab OT Goals Patient Stated Goal: Pt did not state but agreeable to OOB with OT OT Goal Formulation: Patient unable to participate in goal setting Time For Goal Achievement: 11/12/22 Potential to Achieve Goals: Good  OT Frequency: Min 2X/week       AM-PAC OT "6 Clicks" Daily Activity  Outcome Measure Help from another person eating meals?: A Little Help from another person taking care of personal grooming?: A Little Help from another person toileting, which includes using toliet, bedpan, or urinal?: Total Help from another person bathing (including washing, rinsing, drying)?: Total Help from another person to put on and  taking off regular upper body clothing?: A Lot Help from another person to put on and taking off regular lower body clothing?: Total 6 Click Score: 11   End of Session Nurse Communication: Mobility status;Need for lift equipment  Activity Tolerance: Patient tolerated treatment well Patient left: in chair;with call bell/phone within reach  OT Visit Diagnosis: Unsteadiness on feet (R26.81);Other abnormalities of gait and mobility (R26.89);Muscle weakness (generalized) (M62.81);Hemiplegia and hemiparesis;Other symptoms and signs involving cognitive function Hemiplegia - Right/Left: Right Hemiplegia - dominant/non-dominant: Dominant Hemiplegia - caused by: Unspecified (traumatic SAH)                Time: 0630-1601 OT Time Calculation (min): 30 min Charges:  OT General Charges $OT Visit: 1 Visit OT Evaluation $OT Eval High Complexity: 1 High OT Treatments $Self Care/Home Management : 8-22 mins  Shatona Andujar OTR/L 10/29/2022, 10:56 AM

## 2022-10-29 NOTE — Plan of Care (Signed)
  Problem: Education: Goal: Knowledge of General Education information will improve Description: Including pain rating scale, medication(s)/side effects and non-pharmacologic comfort measures Outcome: Progressing   Problem: Health Behavior/Discharge Planning: Goal: Ability to manage health-related needs will improve Outcome: Progressing   Problem: Clinical Measurements: Goal: Ability to maintain clinical measurements within normal limits will improve Outcome: Progressing Goal: Will remain free from infection Outcome: Progressing Goal: Diagnostic test results will improve Outcome: Progressing Goal: Respiratory complications will improve Outcome: Progressing Goal: Cardiovascular complication will be avoided Outcome: Progressing   Problem: Activity: Goal: Risk for activity intolerance will decrease Outcome: Progressing   Problem: Nutrition: Goal: Adequate nutrition will be maintained Outcome: Progressing   Problem: Coping: Goal: Level of anxiety will decrease Outcome: Progressing   Problem: Elimination: Goal: Will not experience complications related to bowel motility Outcome: Progressing Goal: Will not experience complications related to urinary retention Outcome: Progressing   Problem: Pain Managment: Goal: General experience of comfort will improve Outcome: Progressing   Problem: Safety: Goal: Ability to remain free from injury will improve Outcome: Progressing   Problem: Skin Integrity: Goal: Risk for impaired skin integrity will decrease Outcome: Progressing   Problem: Education: Goal: Knowledge of disease or condition will improve Outcome: Progressing Goal: Knowledge of secondary prevention will improve (MUST DOCUMENT ALL) Outcome: Progressing Goal: Knowledge of patient specific risk factors will improve (Mark N/A or DELETE if not current risk factor) Outcome: Progressing   Problem: Spontaneous Subarachnoid Hemorrhage Tissue Perfusion: Goal:  Complications of Spontaneous Subarachnoid Hemorrhage will be minimized Outcome: Progressing   Problem: Coping: Goal: Will verbalize positive feelings about self Outcome: Progressing Goal: Will identify appropriate support needs Outcome: Progressing   Problem: Health Behavior/Discharge Planning: Goal: Ability to manage health-related needs will improve Outcome: Progressing Goal: Goals will be collaboratively established with patient/family Outcome: Progressing   Problem: Self-Care: Goal: Ability to participate in self-care as condition permits will improve Outcome: Progressing Goal: Verbalization of feelings and concerns over difficulty with self-care will improve Outcome: Progressing Goal: Ability to communicate needs accurately will improve Outcome: Progressing   Problem: Nutrition: Goal: Risk of aspiration will decrease Outcome: Progressing Goal: Dietary intake will improve Outcome: Progressing   

## 2022-10-29 NOTE — Hospital Course (Signed)
AO is a 89yo M w/ hx of prior nontraumatic ICH in 2022, Afib not on AC, HTN, HLD, seizure disorder, dementia, T2DM,  that was admitted for subarachnoid hemorrhage.   Subarachnoid Hemorrhage  L Frontal Cortical Hematoma Pt presented 3 days after a fall at home. CT and MRI noted L frontal cortical hematoma w/ overlying SAH. Neurology was consulted. Pt was resumed on home amlodipine, lisinopril, metoprolol. BP subsequently improved. Pt was given Keppra load and continued home keppra. Prior to discharge, pt was stable and at neurologic baseline. Pt was discharged to SNF.  T2DM  Pt glucose remained controlled w/o metforming or insulin. Home metformin was restarted on discharge.   PCP Follow-up: 1) Statin was discontinued by Neurology due to concern for Cerebral amyloid angiopathy.

## 2022-10-29 NOTE — Progress Notes (Addendum)
     Daily Progress Note Intern Pager: [phone]  Patient name: Samuel Carr Medical record number: [id number] Date of birth: 11-Jan-1930 Age: 89 y.o. Gender: male  Primary Care Provider: Loura Pardon, MD Consultants: Neurology Code Status: Full  Pt Overview and Major Events to Date:  1/20 Admitted   Assessment and Plan: AO is a 89 yo M admitted after a fall and found to have [place], L frontal hematoma.   PMHx pertinent for Afib (not on antibcoag), dementia, T2DM, HLD, HTN, seizure, prior SAH  * Subarachnoid hemorrhage [facility]) Neurology signed off with plans to follow up out patient in 4 weeks. - f/u PT/OT recs - Goal SBP <140 - Fall precautions  Hypertension Goal SBP <150. -Continue home metoprolol, amlodipine -IV hydralazine as needed to maintain goal  Seizure disorder (HCC) -Continue home Keppra 500 mg twice daily -Seizure precautions  Fall -Fall precautions -orthostatic vitals -Cardiac monitoring -PT/OT  Dementia ([place]) -Delirium precautions -Continue home Seroquel for sleep  AKI (acute kidney injury) ([place]) F/u BMP   Diabetes mellitus without complication ([place]) - CBGs w/ meals and bedtime - add SSI if indicated       FEN/GI: Regular PPx: SCDs Dispo:SNF when bed is available.   Subjective:  NAEO. Eating breakfast. Feels fine.  Objective: Temp:  [97 F (36.1 C)-99.6 F (37.6 C)] 98.4 F (36.9 C) (01/22 1633) Pulse Rate:  [60-78] 69 (01/22 1633) Resp:  [15-20] 16 (01/22 1633) BP: (100-140)/(59-85) 140/66 (01/22 1633) SpO2:  [97 %-100 %] 100 % (01/22 1633) Physical Exam: General: Alert, sitting up and eating breakfast w/ assistance. NAD.  Cardiovascular: RRR, no murmurs. Respiratory: Normal WOB on RA Abdomen: Normal BS. Soft, nontender.  Laboratory: Most recent CBC Lab Results  Component Value Date   WBC 4.7 10/29/2022   HGB 11.4 (L) 10/29/2022   HCT 32.8 (L) 10/29/2022   MCV 94.5 10/29/2022   PLT 172 10/29/2022   Most recent  BMP    Latest Ref Rng & Units 10/29/2022    4:19 AM  BMP  Glucose 70 - 99 mg/dL 119   BUN 8 - 23 mg/dL 24   Creatinine 0.61 - 1.24 mg/dL 1.43   Sodium 135 - 145 mmol/L 137   Potassium 3.5 - 5.1 mmol/L 3.9   Chloride 98 - 111 mmol/L 107   CO2 22 - 32 mmol/L 21   Calcium 8.9 - 10.3 mg/dL 8.3    Arlyce Dice, MD 10/28/2022  PGY-1, [place] Intern pager: [phone], text pages welcome Secure chat group [place]    I have interviewed and examined the patient.  I have discussed the case with Dr. Truett Perna.   I agree with their documentation and management in their note for today.    LOS: 3 days                                        For questions or updates, please contact Family Medicine Teaching Service Resident On-call at pager [phone] or  [email or web address] - see pager "[place]"

## 2022-10-29 NOTE — Progress Notes (Signed)
FMTS Interim Progress Note  Received page from RN about patient trying to get out of bed and removing his external catheter. Evaluated patient at bedside with Dr. Larae Grooms. Patient was able to be oriented to time of day (night) and was able to fall asleep prior to [place] leaving the floor.    AMS:  - redirect patient  - avoid restraints  - order tele-sitter for safety  - re-ordered Seroquel PRN for agitation   Darci Current, DO 10/29/2022, 10:58 PM PGY-1, [place] Service pager [phone]

## 2022-10-29 NOTE — Progress Notes (Signed)
STROKE TEAM PROGRESS NOTE   INTERVAL HISTORY No family is at the bedside. Pt sitting in chair, smile, in good spirit. No acute event overnight. BP stable.   Vitals:   10/28/22 2210 10/28/22 2325 10/29/22 0307 10/29/22 0735  BP: 100/85 (!) 107/59 102/62 126/66  Pulse: 63 62 60 78  Resp: 20 17 20 18   Temp:  99.6 F (37.6 C) (!) 97 F (36.1 C) 98.1 F (36.7 C)  TempSrc:  Oral Axillary Oral  SpO2: 97% 99% 100% 98%  Weight:      Height:       CBC:  Recent Labs  Lab 10/28/22 1454 10/29/22 0419  WBC 5.3 4.7  HGB 11.9* 11.4*  HCT 35.5* 32.8*  MCV 93.9 94.5  PLT 189 423   Basic Metabolic Panel:  Recent Labs  Lab 10/28/22 1454 10/29/22 0419  NA 137 137  K 3.8 3.9  CL 105 107  CO2 24 21*  GLUCOSE 137* 119*  BUN 13 24*  CREATININE 1.36* 1.43*  CALCIUM 8.7* 8.3*   Lipid Panel:  Recent Labs  Lab 10/28/22 1454  CHOL 191  TRIG 77  HDL 45  CHOLHDL 4.2  VLDL 15  LDLCALC 131*   HgbA1c:  Recent Labs  Lab 10/28/22 1454  HGBA1C 6.3*   Urine Drug Screen: No results for input(s): "LABOPIA", "COCAINSCRNUR", "LABBENZ", "AMPHETMU", "THCU", "LABBARB" in the last 168 hours.  Alcohol Level No results for input(s): "ETH" in the last 168 hours.  IMAGING past 24 hours ECHOCARDIOGRAM COMPLETE  Result Date: 10/28/2022    ECHOCARDIOGRAM REPORT   Patient Name:   Samuel Carr Date of Exam: 10/28/2022 Medical Rec #:  [id number]         Height:       72.0 in Accession #:    [id number]        Weight:       297.8 lb Date of Birth:  1930-08-23          BSA:          2.523 m Patient Age:    89 years          BP:           141/63 mmHg Patient Gender: M                 HR:           64 bpm. Exam Location:  Inpatient Procedure: 2D Echo, Cardiac Doppler and Color Doppler Indications:    I63.9 Stroke  History:        Patient has prior history of Echocardiogram examinations, most                 recent 09/22/2020. Stroke; Risk Factors:Hypertension, Diabetes                 and Dyslipidemia. Hx of  Dementia.  Sonographer:    Alvino Chapel RCS Referring Phys: [id number] [place]  1. Left ventricular ejection fraction, by estimation, is 60 to 65%. The left ventricle has normal function. The left ventricle has no regional wall motion abnormalities. There is mild left ventricular hypertrophy. Left ventricular diastolic parameters are consistent with Grade I diastolic dysfunction (impaired relaxation).  2. Right ventricular systolic function is normal. The right ventricular size is normal.  3. The mitral valve is degenerative. No evidence of mitral valve regurgitation. No evidence of mitral stenosis.  4. The aortic valve was not well visualized. Aortic valve regurgitation is not visualized. No aortic  stenosis is present. FINDINGS  Left Ventricle: Left ventricular ejection fraction, by estimation, is 60 to 65%. The left ventricle has normal function. The left ventricle has no regional wall motion abnormalities. Definity contrast agent was given IV to delineate the left ventricular  endocardial borders. The left ventricular internal cavity size was normal in size. There is mild left ventricular hypertrophy. Left ventricular diastolic parameters are consistent with Grade I diastolic dysfunction (impaired relaxation). Right Ventricle: The right ventricular size is normal. Right vetricular wall thickness was not well visualized. Right ventricular systolic function is normal. Left Atrium: Left atrial size was normal in size. Right Atrium: Right atrial size was normal in size. Pericardium: Trivial pericardial effusion is present. Presence of epicardial fat layer. Mitral Valve: The mitral valve is degenerative in appearance. No evidence of mitral valve regurgitation. No evidence of mitral valve stenosis. MV peak gradient, 8.1 mmHg. The mean mitral valve gradient is 2.0 mmHg. Tricuspid Valve: The tricuspid valve is normal in structure. Tricuspid valve regurgitation is trivial. Aortic Valve: The aortic valve was  not well visualized. Aortic valve regurgitation is not visualized. No aortic stenosis is present. Pulmonic Valve: The pulmonic valve was not well visualized. Pulmonic valve regurgitation is not visualized. Aorta: The aortic root is normal in size and structure. IAS/Shunts: The interatrial septum was not well visualized.  LEFT VENTRICLE PLAX 2D LVIDd:         4.30 cm   Diastology LVIDs:         2.90 cm   LV e' medial:    4.68 cm/s LV PW:         1.20 cm   LV E/e' medial:  10.4 LV IVS:        1.30 cm   LV e' lateral:   4.13 cm/s LVOT diam:     2.10 cm   LV E/e' lateral: 11.8 LV SV:         69 LV SV Index:   27 LVOT Area:     3.46 cm  RIGHT VENTRICLE RV S prime:     11.70 cm/s TAPSE (M-mode): 2.2 cm LEFT ATRIUM           Index        RIGHT ATRIUM           Index LA diam:      2.60 cm 1.03 cm/m   RA Area:     11.30 cm LA Vol (A2C): 90.6 ml 35.90 ml/m  RA Volume:   22.80 ml  9.04 ml/m LA Vol (A4C): 46.6 ml 18.47 ml/m  AORTIC VALVE LVOT Vmax:   95.80 cm/s LVOT Vmean:  69.100 cm/s LVOT VTI:    0.200 m  AORTA Ao Root diam: 3.70 cm MITRAL VALVE MV Area (PHT): 1.84 cm     SHUNTS MV Area VTI:   1.92 cm     Systemic VTI:  0.20 m MV Peak grad:  8.1 mmHg     Systemic Diam: 2.10 cm MV Mean grad:  2.0 mmHg MV Vmax:       1.42 m/s MV Vmean:      56.8 cm/s MV Decel Time: 412 msec MV E velocity: 48.70 cm/s MV A velocity: 122.00 cm/s MV E/A ratio:  0.40 Epifanio Lesches MD Electronically signed by Epifanio Lesches MD Signature Date/Time: 10/28/2022/2:54:09 PM    Final     PHYSICAL EXAM General: Alert, well-developed, well-nourished elderly patient in no acute distress Respiratory: Regular, unlabored respirations on room air Cardiovascular: Atrial fibrillation seen on  monitor  Neuro - pt sitting in chair, awake alert, orientated to self but not to age, place, time. Paucity of speech and not coherent but following all simple commands although hard of hearing. Able to name and repeat simple sentences. No gaze palsy,  tracking bilaterally, blinking to visual threat bilaterally. No significant facial droop. Tongue midline. Bilateral UEs 4/5, no drift. LLE barely 3/5 but RLE 2/5 likely due to positioning, BLE distal ankle DF/PF 4/5. Sensation, coordination not cooperative and gait not tested.     ASSESSMENT/PLAN Samuel Carr is a 89 y.o. male with history of ICH, seizures, diabetes, dementia, atrial fibrillation not on anticoagulation and hyperlipidemia presenting after he had an unwitnessed fall and was found to have a left frontal cortical hematoma with overlying subarachnoid hemorrhage.  His blood pressure was under good control, and he did not require ICU care, so he was admitted to the progressive unit.  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  CT head IPH in left superior frontal lobe with overlying subarachnoid hemorrhage CTA head & neck no LVO or hemodynamically significant stenosis Follow-up CT 1/20 0917 no progression of left IPH Follow-up CT 1/20 at 8 PM no significant change in left posterior frontal IPH, Small areas of subdural hemorrhage or petechial hemorrhage involving the right posterior frontal and parietal convexity MRI new areas of extra-axial hyperintensity on FLAIR right occipital and posterior right parietal lobes, unchanged left frontoparietal IPH with overlying SAH, chronic hypertensive angiopathy and remote TBI 2D Echo EF 60-65% LDL 131 HgbA1c 6.3 VTE prophylaxis -SCDs No antithrombotic prior to admission, now on No antithrombotic secondary to IPH and possible CAA Therapy recommendations: SNF Disposition: Pending  Seizure disorder Patient developed a seizure disorder after his prior ICH No seizure activity seen this admission Continue Keppra 500  mg twice daily  PAF Not on AC due to previous ICH  No antithrombotics now given concern of CAA  Hypertension Home meds: Lisinopril 5 mg daily, amlodipine 5 mg daily, continued in hospital Stable Keep systolic blood pressure less than 140 given possible CAA Long-term BP goal normotensive  Hyperlipidemia Home meds: Atorvastatin 80 mg daily LDL 131, goal < 70 Recommend against statin at this time with concern  of CAA No SATURN trial candidate due to dementia.  Diabetes type II Controlled Home meds: Metformin 500 mg twice daily HgbA1c 6.1, goal < 7.0 CBGs SSI  Other Stroke Risk Factors Advanced Age >/= 66  Obesity, Body mass index is 40.39 kg/m., BMI >/= 30 associated with increased stroke risk, recommend weight loss, diet and exercise as appropriate   Other Active Problems Dementia-continue home Seroquel  Hospital day # 2  Neurology will sign off. Please call with questions. Pt will follow up with Dr. Delice Lesch at [facility] in about 4 weeks. Thanks for the consult.   Rosalin Hawking, MD PhD Stroke Neurology 10/29/2022 11:01 AM   To contact Stroke Continuity provider, please refer to [email or web address] After hours, contact General Neurology

## 2022-10-30 DIAGNOSIS — I609 Nontraumatic subarachnoid hemorrhage, unspecified: Secondary | ICD-10-CM | POA: Diagnosis not present

## 2022-10-30 LAB — GLUCOSE, CAPILLARY
Glucose-Capillary: 133 mg/dL — ABNORMAL HIGH (ref 70–99)
Glucose-Capillary: 135 mg/dL — ABNORMAL HIGH (ref 70–99)
Glucose-Capillary: 150 mg/dL — ABNORMAL HIGH (ref 70–99)
Glucose-Capillary: 88 mg/dL (ref 70–99)

## 2022-10-30 NOTE — Care Management Important Message (Signed)
Important Message  Patient Details  Name: Samuel Carr MRN: [id number] Date of Birth: November 08, 1929   Medicare Important Message Given:  Yes     Hannah Beat 10/30/2022, 11:43 AM

## 2022-10-30 NOTE — TOC Initial Note (Signed)
Transition of Care [facility]) - Initial/Assessment Note    Patient Details  Name: Samuel Carr MRN: [id number] Date of Birth: 1929/12/24  [facility]) CM/SW Contact:    Vinie Sill, LCSW Phone Number: 10/30/2022, 11:57 AM  Clinical Narrative:                  CSW spoke with patient's daughter, Shirlean Mylar. CSW introduced self and explained role. She confirmed patient is in the home with her and she is his caregiver. CSW informed of theray recommendations for short rehab at [facility]. She states she is aware and is agrees to recommendation. CSW explained the SNF process. Preferred SNF is [facility]. Family is agreeable to send referrals to other in the [place] area. All questions answered.   TOC will provide bed offers once available.   Thurmond Butts, MSW, LCSW Clinical Social Worker    Expected Discharge Plan: Skilled Nursing Facility Barriers to Discharge: Continued Medical Work up, SNF Pending bed offer   Patient Goals and CMS Choice            Expected Discharge Plan and Services In-house Referral: Clinical Social Work                                            Prior Living Arrangements/Services   Lives with:: Self, Adult Children Patient language and need for interpreter reviewed:: No        Need for Family Participation in Patient Care: Yes (Comment) Care giver support system in place?: Yes (comment)   Criminal Activity/Legal Involvement Pertinent to Current Situation/Hospitalization: No - Comment as needed  Activities of Daily Living      Permission Sought/Granted         Permission granted to share info w AGENCY: SNFs  Permission granted to share info w Relationship: daughter     Emotional Assessment       Orientation: : Oriented to Self Alcohol / Substance Use: Not Applicable Psych Involvement: No (comment)  Admission diagnosis:  Intracranial bleeding ([place]) [I62.9] Fall [W19.XXXA] Subarachnoid bleed ([place]) [I60.9] Fall,  initial encounter [W19.XXXA] Patient Active Problem List   Diagnosis Date Noted   Encephalomalacia on imaging study 10/28/2022   Abnormality of gait and mobility 10/28/2022   Subarachnoid hemorrhage ([place]) 10/27/2022   Subarachnoid bleed ([place]) 10/27/2022   Seizure disorder ([place]) 07/02/2021   Generalized muscle weakness 07/02/2021   Type 2 diabetes mellitus, without long-term current use of insulin ([place]) 05/23/2021   Dementia ([place]) 05/13/2021   Fall 09/22/2020   Hypertension 09/22/2020   Diabetes mellitus without complication ([place])    Hyperlipidemia    AKI (acute kidney injury) ([place])    PCP:  Loura Pardon, MD Pharmacy:   [id number] [place], [place] - [address] [place] [address] Phone: [phone] Fax: [phone]  [id number] - [place], [place] - [address] [address] Phone: [phone] Fax: [phone]     Social Determinants of Health (SDOH) Social History: SDOH Screenings   Tobacco Use: Low Risk  (10/28/2022)   SDOH Interventions:     Readmission Risk Interventions     No data to display

## 2022-10-30 NOTE — NC FL2 (Signed)
[place] LEVEL OF CARE FORM     IDENTIFICATION  Patient Name: Samuel Carr Birthdate: 12-16-1929 Sex: male Admission Date (Current Location): 10/26/2022  [facility] and [place] Number:  Herbalist and Address:  [facility], [place] [address] [place], [place] [address]      Provider Number:    Attending Physician Name and Address:  McDiarmid, Blane Ohara, MD  Relative Name and Phone Number:       Current Level of Care: Hospital Recommended Level of Care: [place] Prior Approval Number:    Date Approved/Denied:   PASRR Number: Pending  Discharge Plan: SNF    Current Diagnoses: Patient Active Problem List   Diagnosis Date Noted   Encephalomalacia on imaging study 10/28/2022   Abnormality of gait and mobility 10/28/2022   Subarachnoid hemorrhage ([place]) 10/27/2022   Subarachnoid bleed ([place]) 10/27/2022   Seizure disorder ([place]) 07/02/2021   Generalized muscle weakness 07/02/2021   Type 2 diabetes mellitus, without long-term current use of insulin ([place]) 05/23/2021   Dementia ([place]) 05/13/2021   Fall 09/22/2020   Hypertension 09/22/2020   Diabetes mellitus without complication ([place])    Hyperlipidemia    AKI (acute kidney injury) ([place])     Orientation RESPIRATION BLADDER Height & Weight     Self  Normal Incontinent Weight: 297 lb 13.5 oz (135.1 kg) Height:  6' (182.9 cm)  BEHAVIORAL SYMPTOMS/MOOD NEUROLOGICAL BOWEL NUTRITION STATUS      Continent Diet (please see discharge summary)  AMBULATORY STATUS COMMUNICATION OF NEEDS Skin     Verbally Normal                       Personal Care Assistance Level of Assistance  Bathing, Feeding, Dressing Bathing Assistance: Limited assistance Feeding assistance: Limited assistance Dressing Assistance: Limited assistance     Functional Limitations Info  Sight, Hearing, Speech Sight Info: Adequate Hearing Info: Impaired Speech Info: Adequate    SPECIAL CARE  FACTORS FREQUENCY  PT (By licensed PT), OT (By licensed OT)     PT Frequency: 5x per week OT Frequency: 5x per week            Contractures Contractures Info: Not present    Additional Factors Info  Code Status, Allergies Code Status Info: FULL Allergies Info: NKA           Current Medications (10/30/2022):  This is the current hospital active medication list Current Facility-Administered Medications  Medication Dose Route Frequency Provider Last Rate Last Admin   acetaminophen (TYLENOL) tablet 650 mg  650 mg Oral Q4H PRN Darci Current, DO   650 mg at 10/30/22 0949   Or   acetaminophen (TYLENOL) 160 MG/5ML solution 650 mg  650 mg Per Tube Q4H PRN Darci Current, DO       Or   acetaminophen (TYLENOL) suppository 650 mg  650 mg Rectal Q4H PRN Darci Current, DO       amLODipine (NORVASC) tablet 5 mg  5 mg Oral Daily Darci Current, DO   5 mg at 10/30/22 5638   hydrALAZINE (APRESOLINE) injection 5 mg  5 mg Intravenous PRN Darci Current, DO       levETIRAcetam (KEPPRA) tablet 500 mg  500 mg Oral BID Darci Current, DO   500 mg at 10/30/22 0950   metoprolol tartrate (LOPRESSOR) tablet 25 mg  25 mg Oral BID Darci Current, DO   25 mg at 10/30/22 7564   Oral care mouth  rinse  15 mL Mouth Rinse PRN McDiarmid, Blane Ohara, MD       polyethylene glycol (MIRALAX / GLYCOLAX) packet 17 g  17 g Oral Daily PRN Darci Current, DO       QUEtiapine (SEROQUEL) tablet 25 mg  25 mg Oral BID Darci Current, DO   25 mg at 10/30/22 6945   senna-docusate (Senokot-S) tablet 1 tablet  1 tablet Oral BID Darci Current, DO   1 tablet at 10/30/22 0388     Discharge Medications: Please see discharge summary for a list of discharge medications.  Relevant Imaging Results:  Relevant Lab Results:   Additional Information SSN [id number]  Vinie Sill, LCSW

## 2022-10-30 NOTE — Evaluation (Signed)
Speech Language Pathology Evaluation Patient Details Name: Samuel Carr MRN: [id number] DOB: 06/11/1930 Today's Date: 10/30/2022 Time: 3295-1884 SLP Time Calculation (min) (ACUTE ONLY): 31 min  Problem List:  Patient Active Problem List   Diagnosis Date Noted   Encephalomalacia on imaging study 10/28/2022   Abnormality of gait and mobility 10/28/2022   Subarachnoid hemorrhage ([place]) 10/27/2022   Subarachnoid bleed ([place]) 10/27/2022   Seizure disorder ([place]) 07/02/2021   Generalized muscle weakness 07/02/2021   Type 2 diabetes mellitus, without long-term current use of insulin ([place]) 05/23/2021   Dementia ([place]) 05/13/2021   Fall 09/22/2020   Hypertension 09/22/2020   Diabetes mellitus without complication ([place])    Hyperlipidemia    AKI (acute kidney injury) ([place])    Past Medical History:  Past Medical History:  Diagnosis Date   Acute metabolic encephalopathy    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 male admitted 1/19 after a fall and found to have SAH, L frontal hematoma.  PMH:  Afib (not on anticoag), dementia, T2DM, HLD, HTN, seizure, prior SAH   Assessment / Plan / Recommendation Clinical Impression  Pt appears to have difficulties with word-finding, comprehension, orientation, and command following, although testing overall is impacted by hearing abilities. Attempted to modify testing to account for hearing, including use of written words or commands. This did not appear to facilitate performance. Unclear what his baseline level of function may be, but today he is oriented to his name,  but is inaccurate in telling me his age, birthday, location, or situation. He has limited recall despite multiple attempts at repetition to try to increase orientation. He named everything on his meal tray as some form of potato (including meat, broccoli). This, combined with imprecise articluation, impacted his ability to make verbal requests clearly as SLP assisted him with his lunch. Question if these are new deficits in light of SAH. Recommend that pt receive ongoing SLP f/u at SNF.    SLP Assessment  SLP Recommendation/Assessment: Patient needs continued Speech [place] Pathology Services SLP Visit Diagnosis: Cognitive communication deficit (R41.841)    Recommendations for follow up therapy are one component of a multi-disciplinary discharge planning process, led by the attending physician.  Recommendations may be updated based on patient status, additional functional criteria and insurance authorization.    Follow Up Recommendations  Skilled nursing-short term rehab (<3 hours/day)    Assistance Recommended at Discharge  Frequent or constant Supervision/Assistance  Functional Status Assessment Patient has had a recent decline in their functional status and demonstrates the ability to make significant improvements in function in a reasonable and predictable amount of time.  Frequency and Duration min 2x/week  2 weeks      SLP Evaluation Cognition  Overall Cognitive Status: No family/caregiver present to determine baseline cognitive functioning Arousal/Alertness: Awake/alert Orientation Level: Oriented to person;Disoriented to place;Disoriented to situation Memory: Impaired Memory Impairment: Decreased recall of new information Awareness: Impaired Awareness Impairment: Emergent impairment Problem Solving: Impaired Problem Solving Impairment: Functional basic Safety/Judgment: Impaired       Comprehension  Auditory Comprehension Overall Auditory Comprehension: Impaired Commands:  Impaired One Step Basic Commands: 50-74% accurate Interfering Components: Hearing EffectiveTechniques: Visual/Gestural cues    Expression Expression Primary Mode of Expression: Verbal Verbal Expression Overall Verbal Expression: Impaired Initiation: No impairment Automatic Speech: Name;Social Response Level of Generative/Spontaneous Verbalization: Phrase Naming: Impairment Confrontation: Impaired Verbal Errors: Semantic paraphasias Non-Verbal Means of Communication: Not applicable   Oral / Motor  Motor Speech Overall Motor Speech: Impaired Respiration: Within functional limits Phonation: Normal Resonance: Within functional limits Articulation: Impaired Level of Impairment: Phrase Intelligibility: Intelligibility reduced Phrase: 50-74% accurate Motor Speech Errors: Not applicable            Osie Bond., M.A. [place] Office [phone]  Secure chat preferred  10/30/2022, 3:13 PM

## 2022-10-30 NOTE — Progress Notes (Signed)
     Daily Progress Note Intern Pager: [phone]  Patient name: Samuel Carr Medical record number: [id number] Date of birth: Jun 13, 1930 Age: 89 y.o. Gender: male  Primary Care Provider: Loura Pardon, MD Consultants: Neurology Code Status: Full  Pt Overview and Major Events to Date:  1/20 Admitted  Assessment and Plan: AO is a 89 yo M admitted after a fall and found to have [place], L frontal hematoma. Pt is medically stable for discharge and awaiting SNF placement.   PMHx pertinent for Afib (not on antibcoag), dementia, T2DM, HLD, HTN, seizure, prior SAH  * Subarachnoid hemorrhage ([place]) - Goal SBP <140 - Fall precautions  Hypertension Goal SBP <150. -Continue home metoprolol, amlodipine -IV hydralazine as needed to maintain goal  Seizure disorder (HCC) -Continue home Keppra 500 mg twice daily -Seizure precautions  Fall -Fall precautions -orthostatic vitals -Cardiac monitoring -PT/OT  Dementia ([place]) -Delirium precautions -Continue home Seroquel for sleep  AKI (acute kidney injury) ([place]) F/u BMP   Diabetes mellitus without complication ([place]) - CBGs w/ meals and bedtime - add SSI if indicated   FEN/GI: Dysphagia 3 PPx: SCDs Dispo:SNF tomorrow. Barriers include pending SNF placement.   Subjective:  NAEO  Objective: Temp:  [97.7 F (36.5 C)-98.7 F (37.1 C)] 98 F (36.7 C) (01/23 1221) Pulse Rate:  [68-78] 68 (01/23 1221) Resp:  [16-18] 16 (01/23 1221) BP: (111-159)/(59-81) 111/64 (01/23 1221) SpO2:  [94 %-100 %] 100 % (01/23 1221) Physical Exam: General: Sleeping comfortably, easily awakens to voice. NAD. Cardiovascular: RRR.  Respiratory: CTAB. Normal WOB on RA. Abdomen: Normal BS.  Laboratory: Most recent CBC Lab Results  Component Value Date   WBC 4.7 10/29/2022   HGB 11.4 (L) 10/29/2022   HCT 32.8 (L) 10/29/2022   MCV 94.5 10/29/2022   PLT 172 10/29/2022   Most recent BMP    Latest Ref Rng & Units 10/29/2022    4:19 AM  BMP   Glucose 70 - 99 mg/dL 119   BUN 8 - 23 mg/dL 24   Creatinine 0.61 - 1.24 mg/dL 1.43   Sodium 135 - 145 mmol/L 137   Potassium 3.5 - 5.1 mmol/L 3.9   Chloride 98 - 111 mmol/L 107   CO2 22 - 32 mmol/L 21   Calcium 8.9 - 10.3 mg/dL 8.3    Arlyce Dice, MD 10/30/2022, 12:31 PM  PGY-1, [place] Intern pager: [phone], text pages welcome Secure chat group [place]

## 2022-10-30 NOTE — Progress Notes (Signed)
Mobility Specialist Progress Note    10/30/22 1612  Mobility  Activity Ambulated with assistance in hallway  Level of Assistance +2 (takes two people) (chair follow)  Assistive Device Front wheel walker  Distance Ambulated (ft) 40 ft (20+20)  Activity Response Tolerated fair  Mobility Referral Yes  $Mobility charge 1 Mobility   Pre-Mobility: 100% SpO2  Pt received in bed and agreeable. No complaints. MinA for bed mobility but modA to stand. Took x1 seated rest break. Returned to bed with call bell in reach. RN notified.   Hildred Alamin Mobility Specialist  Please Psychologist, sport and exercise or Rehab Office at [phone]

## 2022-10-30 NOTE — Progress Notes (Signed)
Physical Therapy Treatment Patient Details Name: Samuel Carr MRN: [id number] DOB: [birth year] Today's Date: 10/30/2022   History of Present Illness Pt is a 89 y.o. male admitted 1/19 after a fall and found to have SAH, L frontal hematoma.  PMH:  Afib (not on antibcoag), dementia, T2DM, HLD, HTN, seizure, prior [facility]    PT Comments    Pt tolerates treatment well with improved balance and transfer quality. Pt continues to demonstrate R inattention and impaired motor planning, with RUE consistently extending and internally rotating with transfer attempts. Pt requires cues to bring attention to RUE, losing attention quickly without cueing. Pt remains at a high risk for falls due to weakness, instability, and impaired awareness of deficits. PT continues to recommend SNF placement.   Recommendations for follow up therapy are one component of a multi-disciplinary discharge planning process, led by the attending physician.  Recommendations may be updated based on patient status, additional functional criteria and insurance authorization.  Follow Up Recommendations  Skilled nursing-short term rehab (<3 hours/day) Can patient physically be transported by private vehicle: Yes   Assistance Recommended at Discharge Frequent or constant Supervision/Assistance  Patient can return home with the following Assistance with cooking/housework;A lot of help with bathing/dressing/bathroom;A lot of help with walking and/or transfers;Direct supervision/assist for medications management;Direct supervision/assist for financial management;Help with stairs or ramp for entrance;Assist for transportation   Equipment Recommendations  Wheelchair (measurements PT);Wheelchair cushion (measurements PT)    Recommendations for Other Services       Precautions / Restrictions Precautions Precautions: Fall Restrictions Weight Bearing Restrictions: No     Mobility  Bed Mobility Overal bed mobility: Needs Assistance Bed  Mobility: Supine to Sit, Sit to Supine     Supine to sit: Min assist, HOB elevated Sit to supine: Min guard, HOB elevated        Transfers Overall transfer level: Needs assistance Equipment used: Rolling walker (2 wheels) Transfers: Sit to/from Stand, Bed to chair/wheelchair/BSC Sit to Stand: Min assist           General transfer comment: pt stands 5x during session, consistently demonstrating RUE internal rotation and shoulder extension behind body with initiation when without PT cues. Pt requires verbal or tactile cueing to involve RUE in transfer due to inattention to R side    Ambulation/Gait Ambulation/Gait assistance: Min assist Gait Distance (Feet): 40 Feet Assistive device: Rolling walker (2 wheels) Gait Pattern/deviations: Step-to pattern, Trunk flexed Gait velocity: reduced Gait velocity interpretation: <1.31 ft/sec, indicative of household ambulator   General Gait Details: slowed step-to gait, increased trunk flexion   Stairs             Wheelchair Mobility    Modified Rankin (Stroke Patients Only)       Balance Overall balance assessment: Needs assistance Sitting-balance support: Single extremity supported, Feet supported Sitting balance-Leahy Scale: Poor Sitting balance - Comments: posterior lean initially, improves with time Postural control: Posterior lean Standing balance support: Bilateral upper extremity supported, Reliant on assistive device for balance, Single extremity supported Standing balance-Leahy Scale: Poor Standing balance comment: minG-minA for static standing                            Cognition Arousal/Alertness: Awake/alert Behavior During Therapy: WFL for tasks assessed/performed Overall Cognitive Status: Impaired/Different from baseline Area of Impairment: Orientation, Attention, Memory, Following commands, Safety/judgement, Awareness, Problem solving                 Orientation Level:  Disoriented to,  Time, Situation, Place Current Attention Level: Sustained Memory: Decreased recall of precautions, Decreased short-term memory Following Commands: Follows one step commands with increased time, Follows multi-step commands inconsistently Safety/Judgement: Decreased awareness of safety, Decreased awareness of deficits Awareness: Intellectual Problem Solving: Slow processing, Difficulty sequencing, Requires verbal cues General Comments: dementia at baseline, [facility]        Exercises      General Comments General comments (skin integrity, edema, etc.): VSS on RA      Pertinent Vitals/Pain Pain Assessment Pain Assessment: No/denies pain    Home Living                          Prior Function            PT Goals (current goals can now be found in the care plan section) Acute Rehab PT Goals Patient Stated Goal: unable to state Progress towards PT goals: Progressing toward goals    Frequency    Min 2X/week      PT Plan Current plan remains appropriate    Co-evaluation              AM-PAC PT "6 Clicks" Mobility   Outcome Measure  Help needed turning from your back to your side while in a flat bed without using bedrails?: A Little Help needed moving from lying on your back to sitting on the side of a flat bed without using bedrails?: A Little Help needed moving to and from a bed to a chair (including a wheelchair)?: A Little Help needed standing up from a chair using your arms (e.g., wheelchair or bedside chair)?: A Little Help needed to walk in hospital room?: A Lot Help needed climbing 3-5 steps with a railing? : Total 6 Click Score: 15    End of Session Equipment Utilized During Treatment: Gait belt Activity Tolerance: Patient tolerated treatment well Patient left: in bed;with call bell/phone within reach;with bed alarm set Nurse Communication: Mobility status PT Visit Diagnosis: Other abnormalities of gait and mobility (R26.89);Muscle weakness  (generalized) (M62.81);Repeated falls (R29.6)     Time: 5597-4163 PT Time Calculation (min) (ACUTE ONLY): 25 min  Charges:  $Gait Training: 8-22 mins $Therapeutic Activity: 8-22 mins                     Zenaida Niece, PT, DPT Acute Rehabilitation Office [place] 10/30/2022, 12:57 PM

## 2022-10-31 DIAGNOSIS — I609 Nontraumatic subarachnoid hemorrhage, unspecified: Secondary | ICD-10-CM | POA: Diagnosis not present

## 2022-10-31 NOTE — Progress Notes (Signed)
     Daily Progress Note Intern Pager: [phone]  Patient name: Samuel Carr Medical record number: [id number] Date of birth: 1930/01/25 Age: 89 y.o. Gender: male  Primary Care Provider: Loura Pardon, MD Consultants: Neurology (signed off) Code Status: Full  Pt Overview and Major Events to Date:  1/20 Admitted  Assessment and Plan: AO is a 89 yo M admitted after a fall and found to have [place], L frontal hematoma. Pt is medically stable for discharge and awaiting SNF placement.   PMHx pertinent for Afib (not on antibcoag), dementia, T2DM, HLD, HTN, seizure, prior SAH  * Subarachnoid hemorrhage ([place]) - Goal SBP <140 - Fall precautions  Hypertension Goal SBP <150. -Continue home metoprolol, amlodipine -IV hydralazine as needed to maintain goal  Seizure disorder (HCC) -Continue home Keppra 500 mg twice daily -Seizure precautions  Dementia ([place]) -Delirium precautions -Continue home Seroquel for sleep   FEN/GI: Dysphagia 3 PPx: SCDs Dispo:SNF today. Barriers include bed placement.   Subjective:  NAEO.   Objective: Temp:  [97.5 F (36.4 C)-98.7 F (37.1 C)] 97.7 F (36.5 C) (01/24 0835) Pulse Rate:  [67-72] 67 (01/24 0835) Resp:  [16-20] 18 (01/24 0835) BP: (104-131)/(51-66) 131/66 (01/24 0835) SpO2:  [96 %-100 %] 99 % (01/24 0835) Physical Exam: General: Alert, friendly. Responding appropriately. NAD. Cardiovascular: RRR Respiratory: CTAB. Normal WOB on RA. Abdomen: Soft, nontender, nondistended. Normal BS.  Laboratory: Most recent CBC Lab Results  Component Value Date   WBC 4.7 10/29/2022   HGB 11.4 (L) 10/29/2022   HCT 32.8 (L) 10/29/2022   MCV 94.5 10/29/2022   PLT 172 10/29/2022   Most recent BMP    Latest Ref Rng & Units 10/29/2022    4:19 AM  BMP  Glucose 70 - 99 mg/dL 119   BUN 8 - 23 mg/dL 24   Creatinine 0.61 - 1.24 mg/dL 1.43   Sodium 135 - 145 mmol/L 137   Potassium 3.5 - 5.1 mmol/L 3.9   Chloride 98 - 111 mmol/L 107   CO2 22 -  32 mmol/L 21   Calcium 8.9 - 10.3 mg/dL 8.3    Arlyce Dice, MD 10/31/2022, 9:21 AM  PGY-1, Dent Intern pager: [phone], text pages welcome Secure chat group [place]

## 2022-10-31 NOTE — Progress Notes (Signed)
Occupational Therapy Treatment Patient Details Name: Samuel Carr MRN: [id number] DOB: Feb 28, 1930 Today's Date: 10/31/2022   History of present illness Pt is a 89 y.o. male admitted 1/19 after a fall and found to have SAH, L frontal hematoma.  PMH:  Afib (not on antibcoag), dementia, T2DM, HLD, HTN, seizure, prior [facility]   OT comments  Pt is making steady progress with OT at this time and currently completes simulated toilet transfers and toileting tasks at mod assist level.  RUE decreased coordination is still present with decreased attention and proprioception also noted.  He is able to integrate the RUE at an active assist level for opening his bottles with increased time and sustained visual attention.  Feel he will continue to progress and benefit from acute care OT at this time.  Recommend SNF for post acute therapy.    Recommendations for follow up therapy are one component of a multi-disciplinary discharge planning process, led by the attending physician.  Recommendations may be updated based on patient status, additional functional criteria and insurance authorization.    Follow Up Recommendations  Skilled nursing-short term rehab (<3 hours/day)     Assistance Recommended at Discharge Frequent or constant Supervision/Assistance  Patient can return home with the following  A little help with walking and/or transfers;A little help with bathing/dressing/bathroom;Assistance with cooking/housework;Assist for transportation;Help with stairs or ramp for entrance;Direct supervision/assist for medications management   Equipment Recommendations  Other (comment)       Precautions / Restrictions Precautions Precautions: Fall Restrictions Weight Bearing Restrictions: No       Mobility Bed Mobility Overal bed mobility: Needs Assistance Bed Mobility: Supine to Sit     Supine to sit: Min assist (increased time) Sit to supine: Min assist   General bed mobility comments: Assist with  bringing LEs off of the bed and sitting up.  When laying down he needed assist for lifting the LEs into the bed.    Transfers Overall transfer level: Needs assistance Equipment used: Rolling walker (2 wheels) Transfers: Sit to/from Stand, Bed to chair/wheelchair/BSC Sit to Stand: Mod assist     Step pivot transfers: Mod assist     General transfer comment: Pt needs mod demonstrational cueing for sit to stand sequence, upright posture, RUE placement, and staying inside of the RW.     Balance Overall balance assessment: Needs assistance Sitting-balance support: Single extremity supported, Feet supported Sitting balance-Leahy Scale: Fair Sitting balance - Comments: Pt able to maintain static sitting balance EOB with close supervision   Standing balance support: Bilateral upper extremity supported, During functional activity Standing balance-Leahy Scale: Poor Standing balance comment: min to mod assist for standing balance.                           ADL either performed or assessed with clinical judgement   ADL Overall ADL's : Needs assistance/impaired                         Toilet Transfer: Moderate assistance;Rolling walker (2 wheels);Ambulation Toilet Transfer Details (indicate cue type and reason): simulated pt using Primofit to urination Toileting- Clothing Manipulation and Hygiene: Minimal assistance       Functional mobility during ADLs: Moderate assistance (ambulation with the RW) General ADL Comments: Pt HOH overall.  Demonstrates some inattention and proprioceptive issues in the RUE.  Will place it behind him when trying to scoot forward without awareness of positon.  Mod assist from  therapist to bring it back beside of him and for stabilization while attempting scooting EOB.  Mod assist for placement on the RW as well.  When pt is demonstrating visual attention to the hand, he was able to remove and replace the top of his soap bottle using the RUE.       Cognition Arousal/Alertness: Awake/alert Behavior During Therapy: WFL for tasks assessed/performed Overall Cognitive Status: No family/caregiver present to determine baseline cognitive functioning Area of Impairment: Orientation, Attention, Memory, Following commands, Safety/judgement, Awareness, Problem solving                 Orientation Level: Disoriented to, Time, Situation, Place Current Attention Level: Sustained Memory: Decreased recall of precautions, Decreased short-term memory Following Commands: Follows one step commands with increased time, Follows multi-step commands inconsistently Safety/Judgement: Decreased awareness of safety, Decreased awareness of deficits Awareness: Intellectual Problem Solving: Slow processing, Requires tactile cues General Comments: Pt HOH.  He needs mod demonstrational cueing for positioning of the RUE on the walker during transfers.                   Pertinent Vitals/ Pain       Pain Assessment Pain Assessment: Faces Pain Score: 0-No pain         Frequency  Min 2X/week        Progress Toward Goals  OT Goals(current goals can now be found in the care plan section)  Progress towards OT goals: Progressing toward goals  Acute Rehab OT Goals Patient Stated Goal: Pt did not state this session Time For Goal Achievement: 11/12/22 Potential to Achieve Goals: Good  Plan         AM-PAC OT "6 Clicks" Daily Activity     Outcome Measure   Help from another person eating meals?: A Little Help from another person taking care of personal grooming?: A Little Help from another person toileting, which includes using toliet, bedpan, or urinal?: A Lot Help from another person bathing (including washing, rinsing, drying)?: A Lot Help from another person to put on and taking off regular upper body clothing?: A Lot Help from another person to put on and taking off regular lower body clothing?: A Lot 6 Click Score: 14    End of  Session Equipment Utilized During Treatment: Gait belt  OT Visit Diagnosis: Unsteadiness on feet (R26.81);Other abnormalities of gait and mobility (R26.89);Muscle weakness (generalized) (M62.81);Hemiplegia and hemiparesis;Other symptoms and signs involving cognitive function Hemiplegia - Right/Left: Right Hemiplegia - dominant/non-dominant: Dominant Hemiplegia - caused by: Unspecified   Activity Tolerance Patient tolerated treatment well   Patient Left with call bell/phone within reach;in bed;with bed alarm set   Nurse Communication Mobility status;Need for lift equipment        Time: 6010-9323 OT Time Calculation (min): 34 min  Charges: OT General Charges $OT Visit: 1 Visit OT Treatments $Self Care/Home Management : 8-22 mins $Therapeutic Activity: 8-22 mins  Terrance Usery OTR/L 10/31/2022, 3:13 PM

## 2022-10-31 NOTE — TOC Progression Note (Addendum)
Transition of Care [facility]) - Progression Note    Patient Details  Name: Samuel Carr MRN: [id number] Date of Birth: 05/14/1930  Transition of Care [facility]) CM/SW [place], [place] Phone Number: 10/31/2022, 1:32 PM  Clinical Narrative:     Update: [facility] confirmed placement and can admit tomorrow.   1:32pm CSW spoke with patient's daughter, she chose [facility]. Sent SNF message to confirm placement, waiting on response.  Received [facility] # [id number]  TOC continues to follow   Expected Discharge Plan: [place] Barriers to Discharge:  (confirmed bed offer)  Expected Discharge Plan and Services In-house Referral: Clinical Social Work                                             Social Determinants of Health (SDOH) Interventions SDOH Screenings   Tobacco Use: Low Risk  (10/28/2022)    Readmission Risk Interventions     No data to display

## 2022-11-01 DIAGNOSIS — I609 Nontraumatic subarachnoid hemorrhage, unspecified: Secondary | ICD-10-CM | POA: Diagnosis not present

## 2022-11-01 NOTE — Progress Notes (Signed)
Physical Therapy Treatment Patient Details Name: Samuel Carr MRN: [id number] DOB: 01-12-1930 Today's Date: 11/01/2022   History of Present Illness Pt is a 89 y.o. male admitted 1/19 after a fall and found to have SAH, L frontal hematoma.  PMH:  Afib (not on antibcoag), dementia, T2DM, HLD, HTN, seizure, prior [facility]    PT Comments    Pt received in bed, agreeable to participation in therapy. Pt smiling and reporting he feels good, 'the best he's felt in a while.' He required mod assist supine to sit, mod assist sit to stand, and min assist amb 25' with RW. R inattention requiring verbal/tactile cues for R hand placement and release on RW. Pt in recliner with feet elevated at end of session.    Recommendations for follow up therapy are one component of a multi-disciplinary discharge planning process, led by the attending physician.  Recommendations may be updated based on patient status, additional functional criteria and insurance authorization.  Follow Up Recommendations  Skilled nursing-short term rehab (<3 hours/day) Can patient physically be transported by private vehicle: Yes   Assistance Recommended at Discharge Frequent or constant Supervision/Assistance  Patient can return home with the following Assistance with cooking/housework;A lot of help with bathing/dressing/bathroom;A lot of help with walking and/or transfers;Direct supervision/assist for medications management;Direct supervision/assist for financial management;Help with stairs or ramp for entrance;Assist for transportation   Equipment Recommendations  Wheelchair (measurements PT);Wheelchair cushion (measurements PT)    Recommendations for Other Services       Precautions / Restrictions Precautions Precautions: Fall Restrictions Weight Bearing Restrictions: No     Mobility  Bed Mobility Overal bed mobility: Needs Assistance Bed Mobility: Supine to Sit     Supine to sit: Mod assist, HOB elevated     General  bed mobility comments: assist with BLE and trunk, increased time, use of bed pad to scoot to EOB    Transfers Overall transfer level: Needs assistance Equipment used: Rolling walker (2 wheels) Transfers: Sit to/from Stand Sit to Stand: Mod assist           General transfer comment: cues for hand placement and sequencing, assist to power up    Ambulation/Gait Ambulation/Gait assistance: Min assist Gait Distance (Feet): 25 Feet Assistive device: Rolling walker (2 wheels) Gait Pattern/deviations: Step-through pattern, Decreased stride length, Trunk flexed, Step-to pattern Gait velocity: decreased Gait velocity interpretation: <1.31 ft/sec, indicative of household ambulator   General Gait Details: Pt maintaining flexed posture BLE and trunk   Stairs             Wheelchair Mobility    Modified Rankin (Stroke Patients Only)       Balance Overall balance assessment: Needs assistance Sitting-balance support: Feet unsupported, Single extremity supported Sitting balance-Leahy Scale: Fair     Standing balance support: Bilateral upper extremity supported, Reliant on assistive device for balance, During functional activity Standing balance-Leahy Scale: Poor                              Cognition Arousal/Alertness: Awake/alert Behavior During Therapy: WFL for tasks assessed/performed Overall Cognitive Status: No family/caregiver present to determine baseline cognitive functioning                                 General Comments: Pleasant and cooperative. Following simple commands consistently with increased time. [facility]        Exercises  General Comments General comments (skin integrity, edema, etc.): R inattention      Pertinent Vitals/Pain Pain Assessment Pain Assessment: Faces Faces Pain Scale: No hurt    Home Living                          Prior Function            PT Goals (current goals can now be found in  the care plan section) Acute Rehab PT Goals Patient Stated Goal: not stated Progress towards PT goals: Progressing toward goals    Frequency    Min 2X/week      PT Plan Current plan remains appropriate    Co-evaluation              AM-PAC PT "6 Clicks" Mobility   Outcome Measure  Help needed turning from your back to your side while in a flat bed without using bedrails?: A Little Help needed moving from lying on your back to sitting on the side of a flat bed without using bedrails?: A Lot Help needed moving to and from a bed to a chair (including a wheelchair)?: A Little Help needed standing up from a chair using your arms (e.g., wheelchair or bedside chair)?: A Lot Help needed to walk in hospital room?: A Little Help needed climbing 3-5 steps with a railing? : Total 6 Click Score: 14    End of Session Equipment Utilized During Treatment: Gait belt Activity Tolerance: Patient tolerated treatment well Patient left: in chair;with call bell/phone within reach;with chair alarm set Nurse Communication: Mobility status PT Visit Diagnosis: Other abnormalities of gait and mobility (R26.89);Muscle weakness (generalized) (M62.81);Repeated falls (R29.6)     Time: 5643-3295 PT Time Calculation (min) (ACUTE ONLY): 16 min  Charges:  $Gait Training: 8-22 mins                     Lorrin Goodell, PT  Office # [phone] Pager [phone]    Lorriane Shire 11/01/2022, 9:27 AM

## 2022-11-01 NOTE — TOC Transition Note (Signed)
Transition of Care [facility]) - CM/SW Discharge Note   Patient Details  Name: Samuel Carr MRN: [id number] Date of Birth: 12/24/1929  Transition of Care [facility]) CM/SW Contact:  Vinie Sill, LCSW Phone Number: 11/01/2022, 1:24 PM   Clinical Narrative:     Patient will Discharge to: [facility]  Discharge Date: 11/01/2022 Family Notified: daughter Transport By: Corey Harold  Per MD patient is ready for discharge. RN, patient, and facility notified of discharge. Discharge Summary sent to facility. RN given number for [phone]. Ambulance transport requested for patient.   Clinical Social Worker signing off.  Thurmond Butts, MSW, LCSW Clinical Social Worker     Final next level of care: Skilled Nursing Facility Barriers to Discharge: Barriers Resolved   Patient Goals and CMS Choice      Discharge Placement                Patient [facility] bed at: [facility] Patient to be transferred to facility by: [place] Name of family member notified: daughter- called son 2x times no answer Patient and family notified of of transfer: 11/01/22  Discharge Plan and Services Additional resources added to the After Visit Summary for   In-house Referral: Clinical Social Work                                   Social Determinants of Health (SDOH) Interventions SDOH Screenings   Tobacco Use: Low Risk  (10/28/2022)     Readmission Risk Interventions     No data to display

## 2022-11-01 NOTE — Progress Notes (Signed)
Attempt x1 to give report to [place] [facility]. Will call again prior to transportation arrival

## 2022-11-01 NOTE — Discharge Summary (Addendum)
[facility] Discharge Summary  Patient name: Samuel Carr Medical record number: [id number] Date of birth: 02/28/1930 Age: 89 y.o. Gender: male Date of Admission: 10/26/2022  Date of Discharge: 11/01/22 Admitting Physician: Blane Ohara McDiarmid, MD  Primary Care Provider: Loura Pardon, MD Consultants: Neurology  Indication for Hospitalization: Fall, intracranial hemorrhage   Brief Hospital Course:  Samuel Carr is a 89yo M w/ hx of prior nontraumatic ICH in 2022, Afib not on AC, HTN, HLD, seizure disorder, dementia, T2DM,  that was admitted for subarachnoid hemorrhage.   Subarachnoid Hemorrhage  L Frontal Cortical Hematoma Pt presented 3 days after a fall at home. CT and MRI noted L frontal cortical hematoma w/ overlying SAH. Neurology was consulted. Pt was resumed on home amlodipine, lisinopril, metoprolol. BP subsequently improved. Pt was given Keppra load and continued home keppra. Prior to discharge, pt was stable and at neurologic baseline. Pt was discharged to SNF.  T2DM  Pt glucose remained controlled w/o metforming or insulin. Home metformin was restarted on discharge.   PCP Follow-up: 1) Statin was discontinued by Neurology due to concern for Cerebral amyloid angiopathy.  Discharge Diagnoses/Problem List:  Subarachnoid Hemorrhage, L Frontal Cortical Hematoma  Disposition: SNF  Discharge Condition: Stable, improved  Discharge Exam:  Gen: Friendly, alert older man sitting up in bed and eating breakfast HEENT: NCAT. MMM Resp: CTAB, normal WOB on RA. No crackles or wheezing.  CV: RRR Abm: Soft, nontender, nondistended. Normal BS.  Significant Labs and Imaging:   CT Head w/o cont 1. No significant interval change in the left posterior frontal convexity hemorrhage. 2. Small areas of subdural hemorrhage or petechial hemorrhage involving the right posterior frontal and parietal convexity, similar to prior CT. 3. Moderate age-related atrophy  and chronic microvascular ischemic changes.  MR Brain 1. New areas of extra-axial hyperintensity on FLAIR imaging at the right occipital and posterior right parietal lobes, which may indicate new sites of hemorrhage. Head CT correlation recommended. 2. Unchanged size of left frontoparietal intraparenchymal hematoma with overlying subarachnoid hemorrhage. 3. 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 4. 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   CTAngio 1. No large vessel occlusion or proximal hemodynamically significant stenosis 2. No visible aneurysm arteriovenous malformation, although acute blood products does limit assessment.   Discharge Medications:  Allergies as of 11/01/2022   No Known Allergies      Medication List     STOP taking these medications    atorvastatin 40 MG tablet Commonly known as: LIPITOR   atorvastatin 80 MG tablet Commonly known as: LIPITOR       TAKE these medications    amLODipine 5 MG tablet Commonly known as: NORVASC Take by mouth.   levETIRAcetam 500 MG tablet Commonly known as: KEPPRA Take 500 mg by mouth 2 (two) times daily.   lisinopril 5 MG tablet Commonly known as: ZESTRIL Take 5 mg by mouth daily.   metFORMIN 500 MG tablet Commonly known as: GLUCOPHAGE Take 1 tablet (500 mg total) by mouth 2 (two) times daily with a meal.   metoprolol tartrate 25 MG tablet Commonly known as: LOPRESSOR Take 25 mg by mouth 2 (two) times daily.   QUEtiapine 25 MG tablet Commonly known as: SEROQUEL Take 25 mg by mouth 2 (two) times daily.        Discharge Instructions: Please refer to Patient Instructions section of EMR for full details.  Patient was counseled important signs and symptoms that should prompt return to medical  care, changes in medications, dietary instructions, activity restrictions, and follow up appointments.   Follow-Up Appointments:  Follow-up Information      Consuella Lose, MD. Schedule an appointment as soon as possible for a visit .   Specialty: Neurosurgery Why: Please call on Monday to schedule follow-up appointment with the neurosurgeon in the next 1 to 2 weeks in the office Contact information: [place] [place] [address] [facility] [place] to .   Specialty: Emergency Medicine Why: If symptoms worsen Contact information: [address] [phone] [place]        Cameron Sprang, MD. Schedule an appointment as soon as possible for a visit in 1 month(s).   Specialty: Neurology Contact information: [place] [address] [phone]                 Arlyce Dice, MD 11/01/2022, 11:58 AM PGY-1, [place] Upper-Level Resident Addendum   I have independently interviewed and examined the patient. I have discussed the above with the original author and agree with their documentation. Please see also any attending notes.   Sonia Side, D.O. PGY-3, [facility] 11/01/2022 12:01 PM  FPTS Service pager: [phone] (text pages welcome through [facility])

## 2022-12-05 ENCOUNTER — Encounter: Payer: Self-pay | Admitting: Neurology

## 2022-12-05 ENCOUNTER — Ambulatory Visit (INDEPENDENT_AMBULATORY_CARE_PROVIDER_SITE_OTHER): Payer: Medicare Other | Admitting: Neurology

## 2022-12-05 VITALS — BP 128/75 | HR 84 | Ht 75.0 in

## 2022-12-05 DIAGNOSIS — G40209 Localization-related (focal) (partial) symptomatic epilepsy and epileptic syndromes with complex partial seizures, not intractable, without status epilepticus: Secondary | ICD-10-CM | POA: Diagnosis not present

## 2022-12-05 DIAGNOSIS — S06890S Other specified intracranial injury without loss of consciousness, sequela: Secondary | ICD-10-CM | POA: Diagnosis not present

## 2022-12-05 DIAGNOSIS — F03B18 Unspecified dementia, moderate, with other behavioral disturbance: Secondary | ICD-10-CM

## 2022-12-05 NOTE — Progress Notes (Signed)
NEUROLOGY FOLLOW UP OFFICE NOTE  Samuel Carr KA:[id number] 1930-04-19  HISTORY OF PRESENT ILLNESS: I had the pleasure of seeing Samuel Carr in follow-up in the neurology clinic on 12/05/2022.  The patient was last seen in the neurology clinic in 2022 for dementia, seizures secondary to [place] in 2022, presenting after hospitalization for another ICH (left frontal) after a fall last 10/26/22. Records were reviewed. He is accompanied by his daughter Shirlean Mylar and granddaughter Corey Skains who help supplement the history today. He had been living with Shirlean Mylar for the past 3 years where they were providing 24/7 care. Shirlean Mylar was managing finances, medications, meals. On 10/27/22, he had a fall and was found to have a left frontal cortical hematoma with overlying SAH and regional edema. Repeat CT head was stable, CTA no vascular abnormality seen. No seizure activity noted, he has been on Levetiracetam '500mg'$  BID since hospitalization for nontraumatic IPH with SAH in 2022 at [facility]. Etiology of bleeds concerning for cerebral amyloid angiopathy with significant cerebral microhemorrhages and hemosiderosis, although posttraumatic hemorrhage can be part of the picture as well. Due to concern for CAA, statin and antithrombotics were stopped. He was discharged to [facility] where he gets 24/7 care. Shirlean Mylar notes he fell out of bed 3 weeks ago, he is strongwilled and tries to get out without help. Staff has reported he wants to wander, they have noticed a difference in his behavior, he wants to get up and go between 1-3am. Seroquel '25mg'$  BID is on his medication list.   Shirlean Mylar notes that he has had some improvement since moving to [place]. When he was living with her, he would not say her name, giving other people's names when asked. But since moving to [place], he recognizes her and says her name correctly. At home he would not know how to use a knife to eat, he feeds himself at [address]. Shirlean Mylar recalls one time he tried to drive the  car putting a knife in the ignition. Shirlean Mylar provides additional information that his 2 sisters in their 10s have Alzheimer's disease. He denies any headaches, focal numbness/tingling, he says he felt lightheaded one time.   This is an 89 year old right-handed man with a history of hypertension, hyperlipidemia, diabetes, presenting for evaluation of memory loss. He is accompanied by his son Sonia Side who helps supplement the history today. Sonia Side reports he had been living alone since his wife passed away in 04/25/2019. Sonia Side started getting phone calls from neighbors that he needed to take the patient's keys, he would drive to the store and have issues. Despite other people driving for him, he would still get the keys. Sonia Side has been managing finances for both parents for the past 5 years, his wife had been doing finances. He can write his name in cursive but he cannot write a sentence.  Sonia Side picked him up last November 2021 after the housekeeper reported he was staying in bed most of the day and was not eating. He was brought to the ER for a right axillary wound check. ER also noted altered mental status. He was back in the ER in December 2021 after a syncopal episode. Sonia Side reports he ate fine that day and went to his room. At that time, they were letting him manage his own medications. His son heard him fall and found him unconscious, face down on the floor with his pants down his ankles. He was lethargic, he seemed to be awake when they turned him over but was not  responding. He vomited twice and had bowel incontinence. He was brought to [facility] where his son reported he has age-related memory loss, recognizing family members, intermittently oriented to time and place, but that day seemed more confused. They checked his pillbox and saw all his pills for the week were taken earlier. I personally reviewed MRI brain without contrast which did not show any acute changes. There was chronic post-traumatic encephalomalacia in  the anterior frontal lobes, posttraumatic chronic hemorrhage, superficial siderosis, chronic microhemorrhages. There was moderate to advanced chronic microvascular disease. He had a Zio monitor which he only wore for 1 day, which showed NSR.   He says he is 89 years old. He denies any headaches, dizziness, diplopia, dysarthria/dysphagia, neck/back pain, bowel/bladder dysfunction, anosmia, or tremors. He sits on his rollator and rolls to the sink where he takes sink baths. Sleep is good. No wandering behavior, paranoia, or hallucinations. Sometimes his thinking goes in a loop about his wife, loss of his wife, thoughts of his life. No family history of seizures. He had 2 sisters with dementia. No significant head injuries or alcohol use.    PAST MEDICAL HISTORY: Past Medical History:  Diagnosis Date   Acute metabolic encephalopathy    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    as per son, family is concerned about dementia   Diabetes mellitus without complication ([place])    History of atrial fibrillation 05/13/2021   Hyperlipidemia    Nontraumatic intracranial hemorrhage, unspecified ([place]) 05/13/2021   Stroke (cerebrum) ([place]) 05/13/2021   Syncope     MEDICATIONS: Current Outpatient Medications on File Prior to Visit  Medication Sig Dispense Refill   amLODipine (NORVASC) 5 MG tablet Take by mouth.     levETIRAcetam (KEPPRA) 500 MG tablet Take 500 mg by mouth 2 (two) times daily.     lisinopril (ZESTRIL) 5 MG tablet Take 5 mg by mouth daily.     metFORMIN (GLUCOPHAGE) 500 MG tablet Take 1 tablet (500 mg total) by mouth 2 (two) times daily with a meal. 60 tablet 1   metoprolol tartrate (LOPRESSOR) 25 MG tablet Take 25 mg by mouth 2 (two) times daily.     QUEtiapine (SEROQUEL) 25 MG tablet Take 25 mg by mouth 2 (two) times daily.     No current facility-administered medications on file prior to visit.    ALLERGIES: No Known Allergies  FAMILY HISTORY: Family History  Problem Relation Age of Onset    Alzheimer's disease Sister    Dementia Sister    Cerebral aneurysm Sister     SOCIAL HISTORY: Social History   Socioeconomic History   Marital status: Widowed    Spouse name: Not on file   Number of children: Not on file   Years of education: Not on file   Highest education level: Not on file  Occupational History   Not on file  Tobacco Use   Smoking status: Never   Smokeless tobacco: Never  Substance and Sexual Activity   Alcohol use: Never   Drug use: Never   Sexual activity: Not on file  Other Topics Concern   Not on file  Social History Narrative   Right handed   Social Determinants of Health   Financial Resource Strain: Not on file  Food Insecurity: Not on file  Transportation Needs: Not on file  Physical Activity: Not on file  Stress: Not on file  Social Connections: Not on file  Intimate Partner Violence: Not on file     PHYSICAL EXAM: Vitals:   12/05/22  1406  BP: 128/75  Pulse: 84  SpO2: 97%   General: No acute distress Head:  Normocephalic/atraumatic Skin/Extremities: No rash, no edema Neurological Exam: alert and awake. No aphasia, able to answer simple questions and follow commands. Fund of knowledge is reduced.  Attention and concentration are normal.   Cranial nerves: Pupils equal, round. Extraocular movements intact with no nystagmus. Visual fields full.  No facial asymmetry.  Motor: Bulk and tone normal, muscle strength 5/5 throughout with orbiting around the right UE. Slight right UE ataxia on finger to nose testing. Gait not tested.   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 He has had recurrent intracranial bleeds, most recently last 10/26/22 with left frontal cortical hematoma with overlying SAH and regional edema. Repeat CT head was stable, CTA no vascular abnormality. Concern raised for cerebral amyloid angiopathy. He is off statins and antithrombotic agents due to CAA. No  seizures reported since 2022, continue Levetiracetam '500mg'$  BID. Continue 24/7 care. Follow-up with Memory Disorders PA Sharene Butters in 6 months, call for any changes.    Thank you for allowing me to participate in his care.  Please do not hesitate to call for any questions or concerns.    Ellouise Newer, M.D.   CC: Dr. Erlinda Hong, Dr. Dorian Pod

## 2022-12-05 NOTE — Patient Instructions (Signed)
Good to see you doing well. Continue all medications. Continue 24/7 care. Follow-up with Memory Disorders PA Sharene Butters in 6 months, call for any changes.    Seizure Precautions: 1. If medication has been prescribed for you to prevent seizures, take it exactly as directed.  Do not stop taking the medicine without talking to your doctor first, even if you have not had a seizure in a long time.   2. Avoid activities in which a seizure would cause danger to yourself or to others.  Don't operate dangerous machinery, swim alone, or climb in high or dangerous places, such as on ladders, roofs, or girders.  Do not drive unless your doctor says you may.  3. If you have any warning that you may have a seizure, lay down in a safe place where you can't hurt yourself.    4.  No driving for 6 months from last seizure, as per [facility].   Please refer to the following link on the [place] website for more information: http://[email or web address].org/[email or web address]/driving/drivingu.cfm   5.  Maintain good sleep hygiene.  6.  Contact your doctor if you have any problems that may be related to the medicine you are taking.  7.  Call 911 and bring the patient back to the ED if:        A.  The seizure lasts longer than 5 minutes.       B.  The patient doesn't awaken shortly after the seizure  C.  The patient has new problems such as difficulty seeing, speaking or moving  D.  The patient was injured during the seizure  E.  The patient has a temperature over 102 F (39C)  F.  The patient vomited and now is having trouble breathing

## 2023-06-06 ENCOUNTER — Ambulatory Visit: Payer: Medicare Other | Admitting: Physician Assistant
